# Patient Record
Sex: Male | Born: 1975
Health system: Southern US, Community
[De-identification: ages and names within clinical notes are randomized; demographics above are authoritative.]

## PROBLEM LIST (undated history)

## (undated) DIAGNOSIS — I10 Essential (primary) hypertension: Secondary | ICD-10-CM

## (undated) DIAGNOSIS — T7840XA Allergy, unspecified, initial encounter: Secondary | ICD-10-CM

## (undated) DIAGNOSIS — G4730 Sleep apnea, unspecified: Secondary | ICD-10-CM

## (undated) DIAGNOSIS — F329 Major depressive disorder, single episode, unspecified: Secondary | ICD-10-CM

## (undated) DIAGNOSIS — D573 Sickle-cell trait: Secondary | ICD-10-CM

## (undated) DIAGNOSIS — F32A Depression, unspecified: Secondary | ICD-10-CM

## (undated) DIAGNOSIS — M109 Gout, unspecified: Secondary | ICD-10-CM

## (undated) HISTORY — DX: Depression, unspecified: F32.A

## (undated) HISTORY — PX: WISDOM TOOTH EXTRACTION: SHX21

## (undated) HISTORY — DX: Allergy, unspecified, initial encounter: T78.40XA

## (undated) HISTORY — PX: POLYPECTOMY: SHX149

## (undated) HISTORY — DX: Sickle-cell trait: D57.3

## (undated) HISTORY — DX: Major depressive disorder, single episode, unspecified: F32.9

## (undated) HISTORY — DX: Sleep apnea, unspecified: G47.30

---

## 2001-04-01 ENCOUNTER — Ambulatory Visit (HOSPITAL_BASED_OUTPATIENT_CLINIC_OR_DEPARTMENT_OTHER): Admission: RE | Admit: 2001-04-01 | Discharge: 2001-04-01 | Payer: Self-pay | Admitting: Urology

## 2001-11-30 ENCOUNTER — Encounter: Payer: Self-pay | Admitting: Emergency Medicine

## 2001-11-30 ENCOUNTER — Emergency Department (HOSPITAL_COMMUNITY): Admission: EM | Admit: 2001-11-30 | Discharge: 2001-11-30 | Payer: Self-pay | Admitting: Emergency Medicine

## 2001-12-13 ENCOUNTER — Emergency Department (HOSPITAL_COMMUNITY): Admission: EM | Admit: 2001-12-13 | Discharge: 2001-12-13 | Payer: Self-pay | Admitting: Emergency Medicine

## 2001-12-13 ENCOUNTER — Encounter: Payer: Self-pay | Admitting: Emergency Medicine

## 2002-10-21 ENCOUNTER — Emergency Department (HOSPITAL_COMMUNITY): Admission: EM | Admit: 2002-10-21 | Discharge: 2002-10-21 | Payer: Self-pay | Admitting: Emergency Medicine

## 2006-09-18 ENCOUNTER — Encounter: Admission: RE | Admit: 2006-09-18 | Discharge: 2006-09-18 | Payer: Self-pay | Admitting: *Deleted

## 2006-09-18 IMAGING — CT CT ABDOMEN WO/W CM
2 of 6 series · 15 of 46 positions shown, 17 images · IV contrast (READICAT/WATER & [ID] OMNI 300)
Comparison: none

CLINICAL DATA: Abdominal pain. 
CT SCAN OF THE ABDOMEN WITH AND WITHOUT CONTRAST:
TECHNIQUE: Multidetector CT imaging of the abdomen was performed both before and during bolus administration of intravenous contrast.
Contrast:  cc Omnipaque 300 and oral contrast.
No comparison.
TECHNIQUE: Multidetector CT imaging of the pelvis was performed following the standard protocol during bolus administration of oral and intravenous contrast.

[Series 4: routine abdomen · axial · 0.90mm/px · z∈[-365,+20]mm · 12 of 92 slices shown, 14 images]
[im 8/92  soft-tissue]
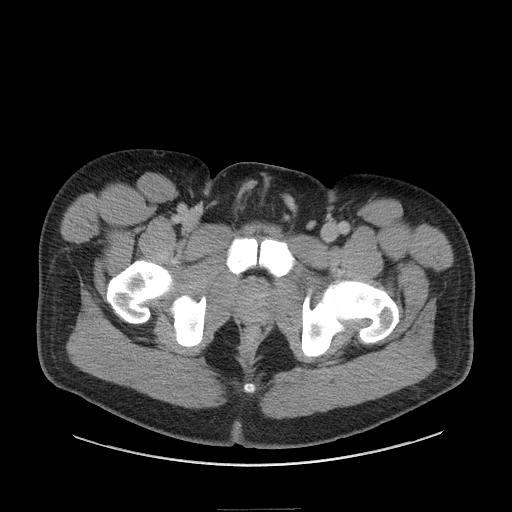
[im 8/92  bone]
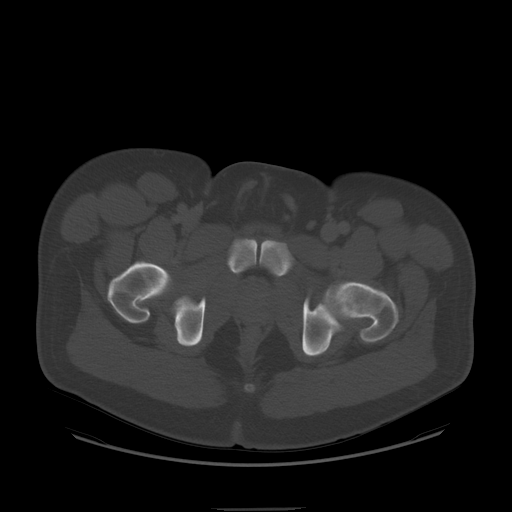
[im 15/92  soft-tissue]
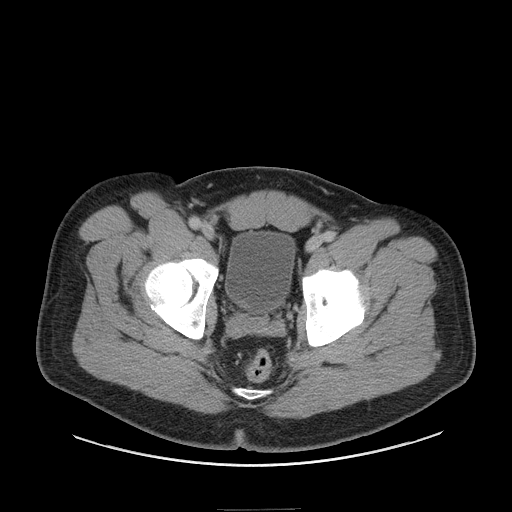
[im 22/92  soft-tissue]
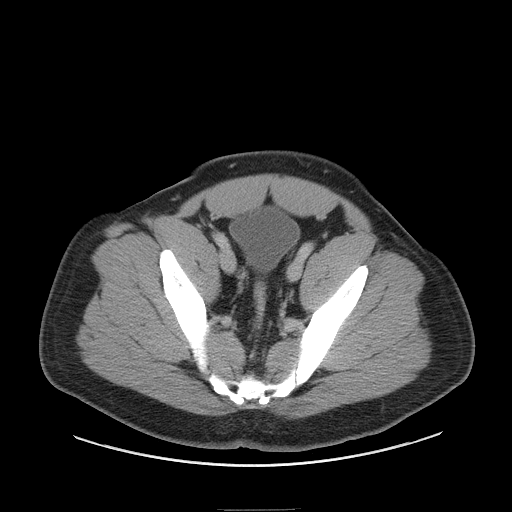
[im 29/92  soft-tissue]
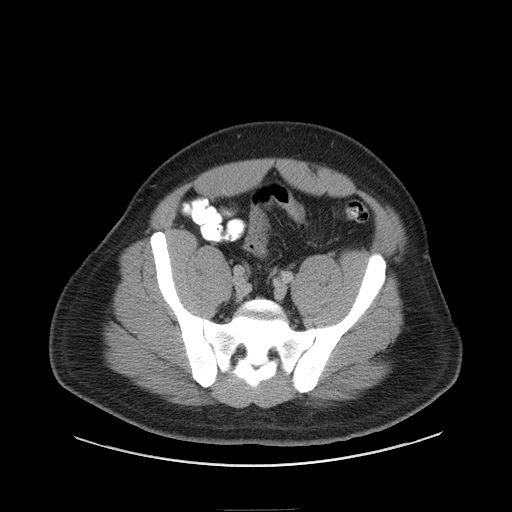
[im 36/92  soft-tissue]
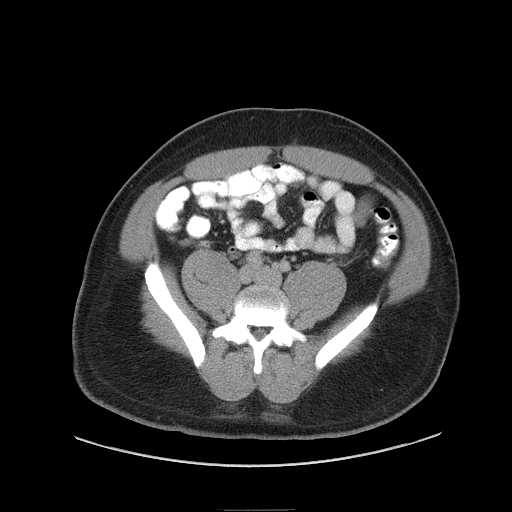
[im 43/92  soft-tissue]
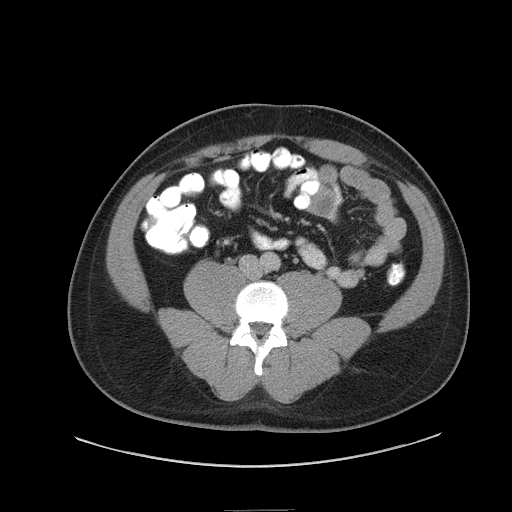
[im 50/92  soft-tissue]
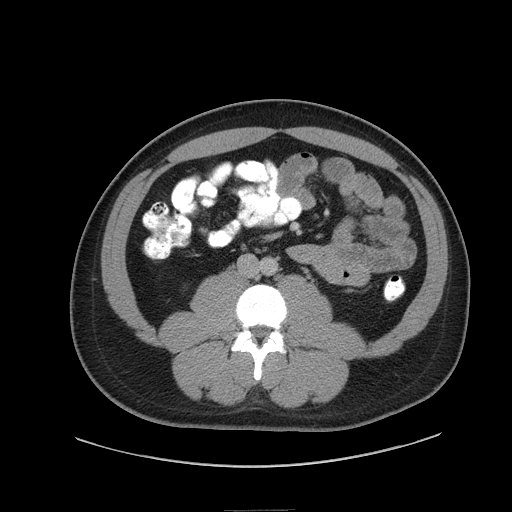
[im 57/92  soft-tissue]
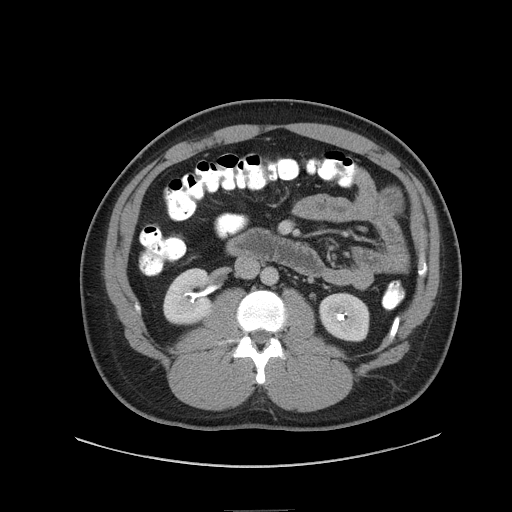
[im 64/92  soft-tissue]
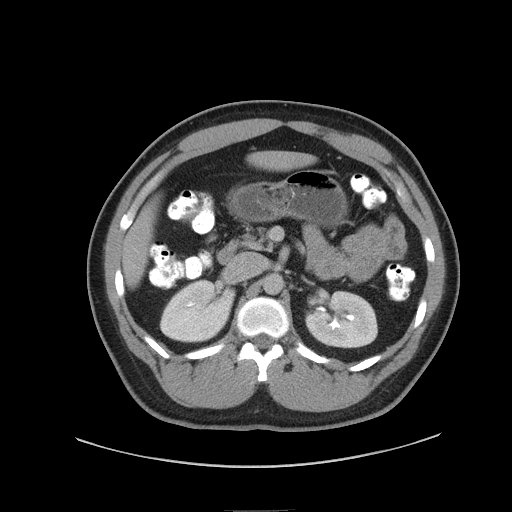
[im 64/92  bone]
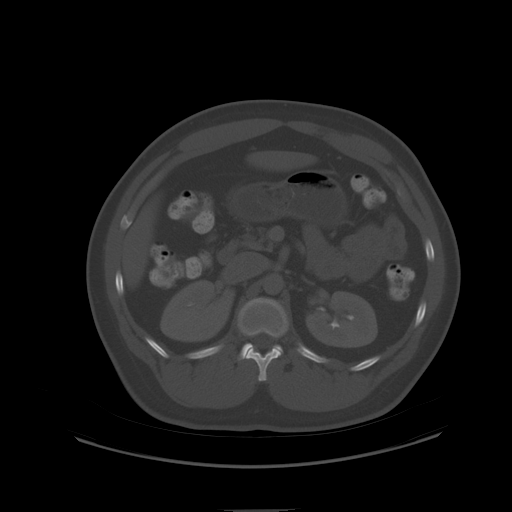
[im 71/92  soft-tissue]
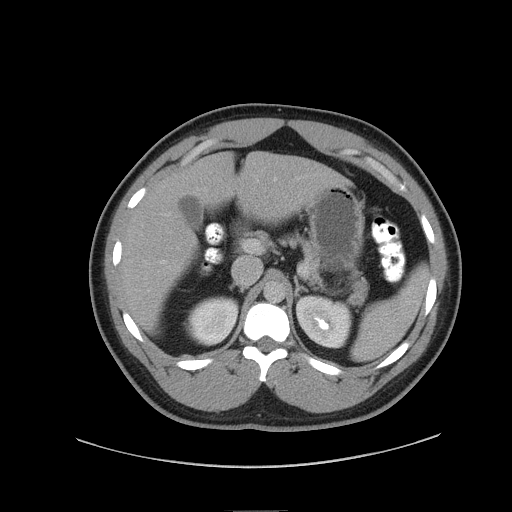
[im 78/92  soft-tissue]
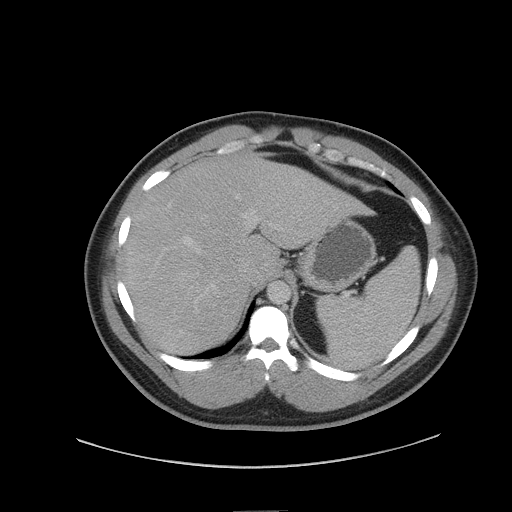
[im 85/92  soft-tissue]
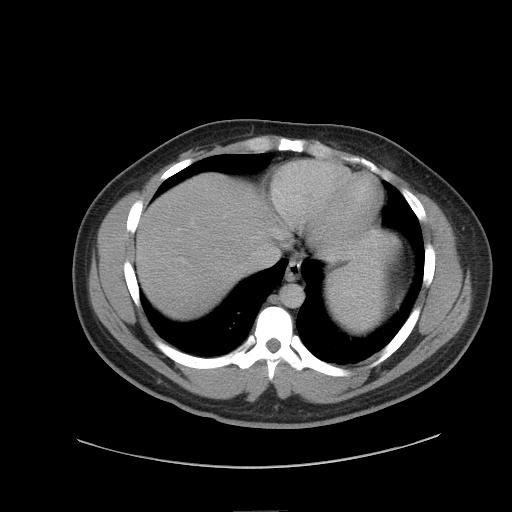

[Series 602: sagittal body · sagittal · 0.90mm/px · 3 of 175 slices shown]
[im 59/175  soft-tissue]
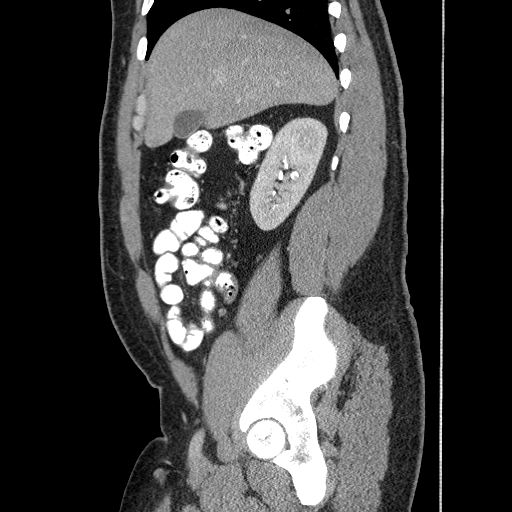
[im 78/175  soft-tissue]
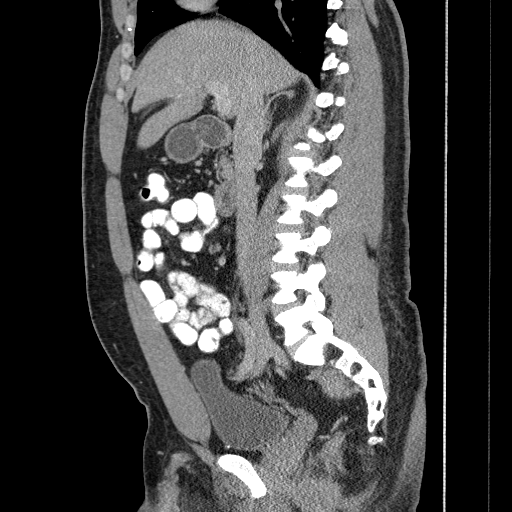
[im 97/175  soft-tissue]
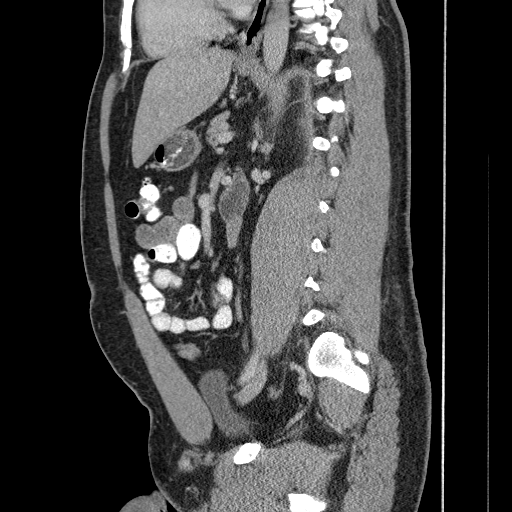

[15 of 46 positions shown; findings below may reference images not displayed]

FINDINGS: Minimal atelectatic changes lung bases.  No renal calculi.  Minimal caliectasis right upper pole region of questionable significance.  No renal mass.  The liver, spleen, pancreas, and adrenal glands unremarkable.  Slight increased number of normal sized mesenteric lymph nodes slightly greater on the right.  Significance indeterminate.  Surrounding small bowel or colonic abnormality is not identified.  Appendix is contrast and air filled without surrounding inflammation.  No hernia.  No abdominal aortic aneurysm.  Very small hiatal hernia.  No bony destructive lesion.
IMPRESSION: 1.  Slight increased number of normal sized to top normal sized mesenteric lymph nodes of questionable etiology or significance.  No surrounding bowel abnormality.
2.   Mild fatty infiltration of the liver without focal lesion.  
3.  There may be a very small sliding type hiatal hernia.
4.  Minimal fullness right upper pole caliceal system may be congenital for this patient as no obstructing lesion is identified.
PELVIS CT WITH CONTRAST:
FINDINGS: Elongated, but benign appearing inguinal lymph nodes.  Minimal lobularity of prostate gland of questionable significance.  No pelvic inflammatory process or abnormal fluid collection.
IMPRESSION: No pelvic abnormal fluid collection or inflammatory process.  Please see above.

## 2007-07-02 ENCOUNTER — Emergency Department (HOSPITAL_COMMUNITY): Admission: EM | Admit: 2007-07-02 | Discharge: 2007-07-02 | Payer: Self-pay | Admitting: Emergency Medicine

## 2007-09-12 ENCOUNTER — Ambulatory Visit: Payer: Self-pay | Admitting: Gastroenterology

## 2007-09-12 LAB — CONVERTED CEMR LAB
ALT: 35 units/L (ref 0–53)
AST: 27 units/L (ref 0–37)
Albumin: 3.8 g/dL (ref 3.5–5.2)
Alkaline Phosphatase: 57 units/L (ref 39–117)
BUN: 13 mg/dL (ref 6–23)
Basophils Absolute: 0.2 10*3/uL — ABNORMAL HIGH (ref 0.0–0.1)
Basophils Relative: 2.5 % — ABNORMAL HIGH (ref 0.0–1.0)
Bilirubin, Direct: 0.1 mg/dL (ref 0.0–0.3)
CO2: 31 meq/L (ref 19–32)
Calcium: 9.7 mg/dL (ref 8.4–10.5)
Chloride: 105 meq/L (ref 96–112)
Creatinine, Ser: 1.4 mg/dL (ref 0.4–1.5)
Eosinophils Absolute: 0.1 10*3/uL (ref 0.0–0.6)
Eosinophils Relative: 1.5 % (ref 0.0–5.0)
GFR calc Af Amer: 76 mL/min
GFR calc non Af Amer: 63 mL/min
Glucose, Bld: 72 mg/dL (ref 70–99)
HCT: 43.9 % (ref 39.0–52.0)
Hemoglobin: 14.9 g/dL (ref 13.0–17.0)
Lymphocytes Relative: 21.3 % (ref 12.0–46.0)
MCHC: 34 g/dL (ref 30.0–36.0)
MCV: 83.8 fL (ref 78.0–100.0)
Monocytes Absolute: 0.6 10*3/uL (ref 0.2–0.7)
Monocytes Relative: 6.5 % (ref 3.0–11.0)
Neutro Abs: 6.9 10*3/uL (ref 1.4–7.7)
Neutrophils Relative %: 68.2 % (ref 43.0–77.0)
Platelets: 228 10*3/uL (ref 150–400)
Potassium: 4.2 meq/L (ref 3.5–5.1)
RBC: 5.23 M/uL (ref 4.22–5.81)
RDW: 14.1 % (ref 11.5–14.6)
Sodium: 141 meq/L (ref 135–145)
TSH: 0.55 microintl units/mL (ref 0.35–5.50)
Total Bilirubin: 0.9 mg/dL (ref 0.3–1.2)
Total Protein: 7.3 g/dL (ref 6.0–8.3)
WBC: 9.9 10*3/uL (ref 4.5–10.5)

## 2007-10-14 ENCOUNTER — Ambulatory Visit: Payer: Self-pay | Admitting: Gastroenterology

## 2007-10-14 ENCOUNTER — Encounter: Payer: Self-pay | Admitting: Gastroenterology

## 2007-10-14 DIAGNOSIS — D126 Benign neoplasm of colon, unspecified: Secondary | ICD-10-CM | POA: Insufficient documentation

## 2007-12-11 DIAGNOSIS — K649 Unspecified hemorrhoids: Secondary | ICD-10-CM | POA: Insufficient documentation

## 2007-12-11 DIAGNOSIS — R109 Unspecified abdominal pain: Secondary | ICD-10-CM | POA: Insufficient documentation

## 2007-12-11 DIAGNOSIS — K589 Irritable bowel syndrome without diarrhea: Secondary | ICD-10-CM | POA: Insufficient documentation

## 2007-12-11 HISTORY — DX: Unspecified hemorrhoids: K64.9

## 2008-06-16 ENCOUNTER — Telehealth: Payer: Self-pay | Admitting: Gastroenterology

## 2008-06-16 ENCOUNTER — Encounter: Payer: Self-pay | Admitting: Gastroenterology

## 2008-07-07 ENCOUNTER — Ambulatory Visit: Payer: Self-pay | Admitting: Gastroenterology

## 2009-09-12 ENCOUNTER — Emergency Department (HOSPITAL_COMMUNITY): Admission: EM | Admit: 2009-09-12 | Discharge: 2009-09-12 | Payer: Self-pay | Admitting: Emergency Medicine

## 2009-09-12 IMAGING — CR DG FOOT COMPLETE 3+V*R*
3 series · 3 of 3 positions shown · non-contrast
Comparison: None available.

CLINICAL DATA: Medial foot pain.

RIGHT FOOT COMPLETE - 3+ VIEW

[view not recorded (1 of 3)]
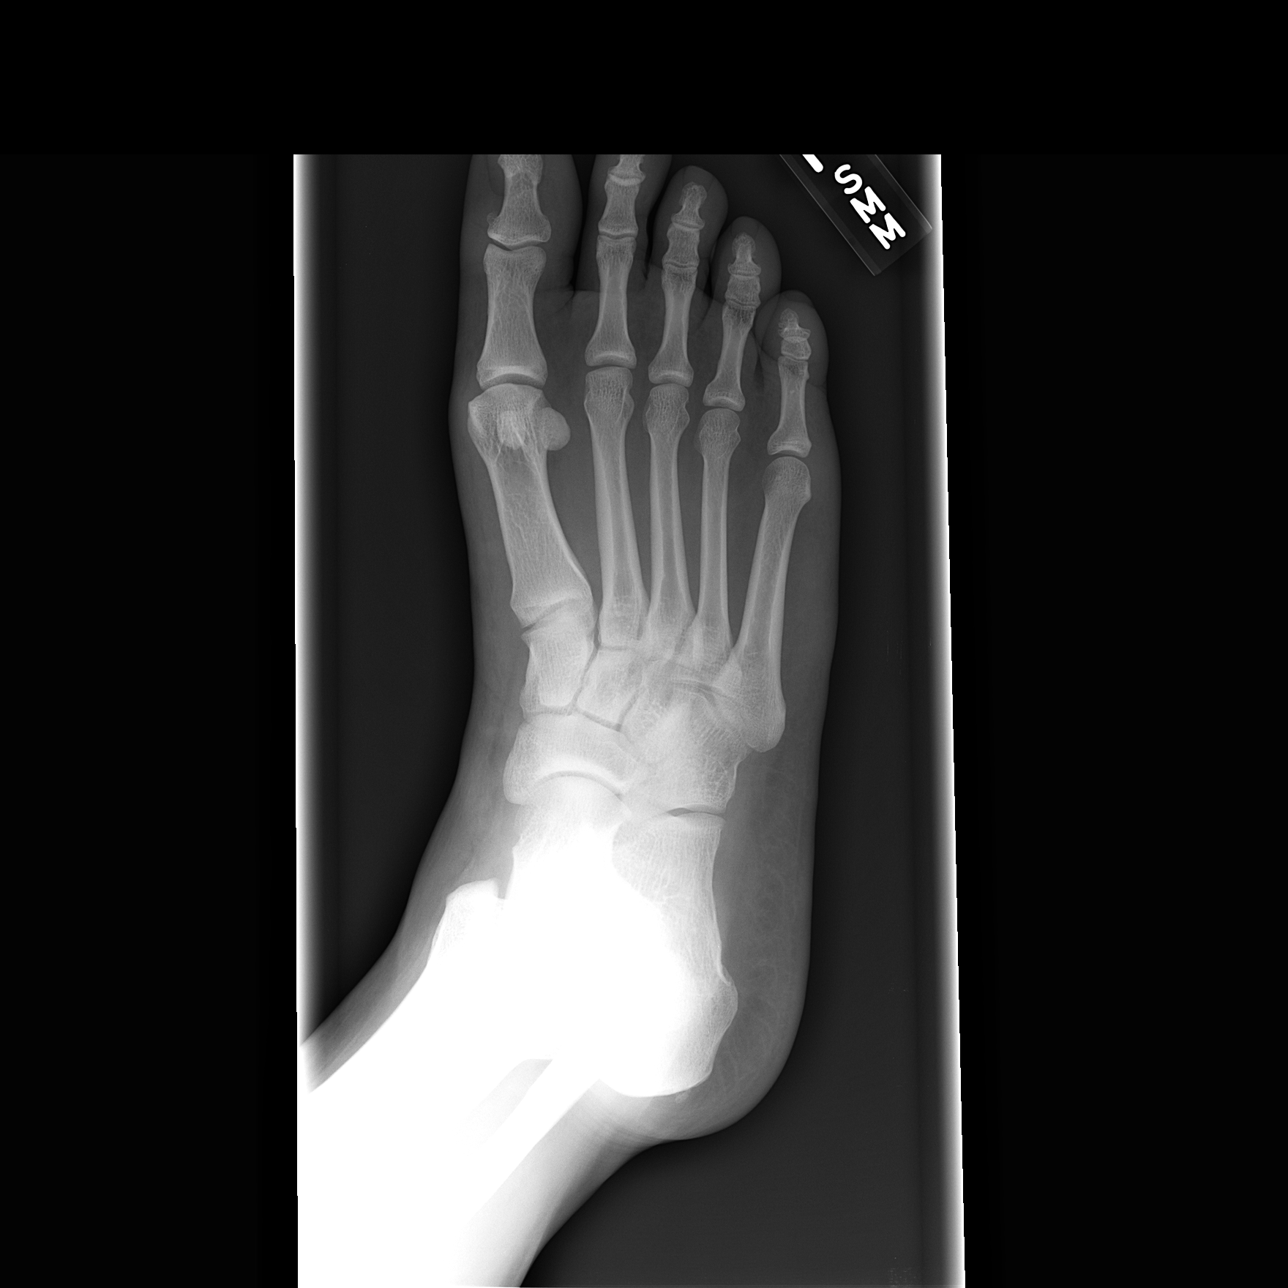

[view not recorded (2 of 3)]
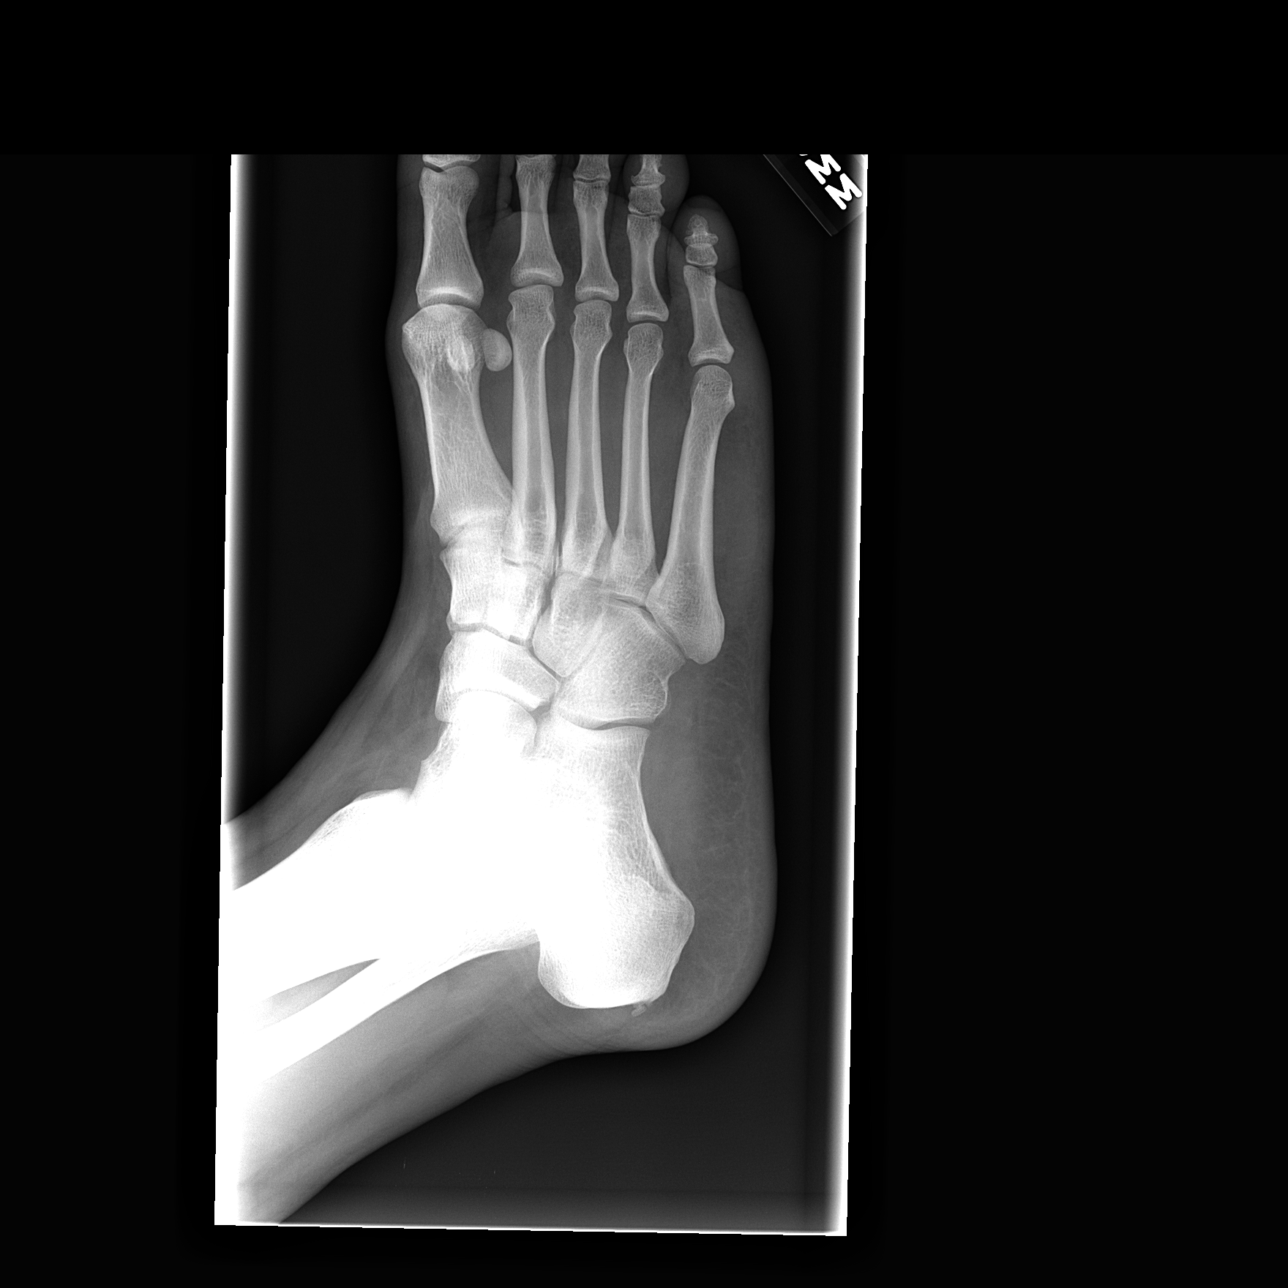

[view not recorded (3 of 3)]
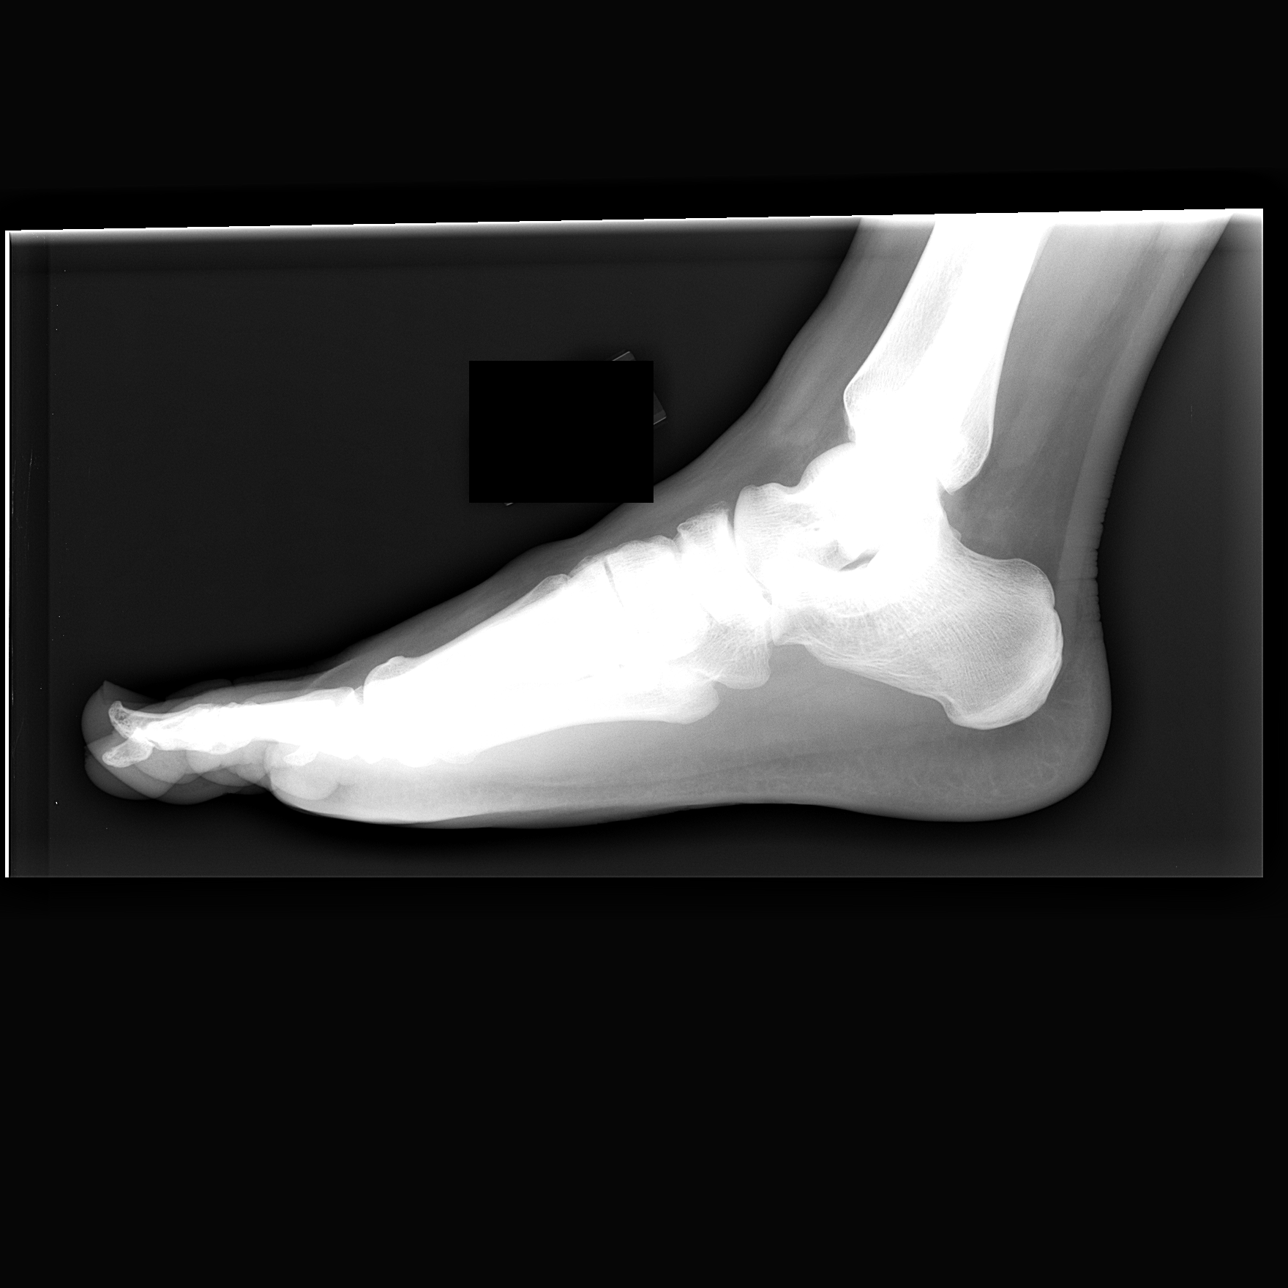

[3 of 3 positions shown; findings below may reference images not displayed]

FINDINGS: There is no acute bony or joint abnormality.  Mild hallux
valgus deformity noted.
IMPRESSION: 1.  No acute finding.
2.  Mild appearing hallux valgus.

## 2010-09-19 ENCOUNTER — Encounter: Payer: Self-pay | Admitting: Gastroenterology

## 2010-10-29 HISTORY — PX: COLONOSCOPY: SHX174

## 2010-11-07 ENCOUNTER — Encounter (INDEPENDENT_AMBULATORY_CARE_PROVIDER_SITE_OTHER): Payer: Self-pay | Admitting: *Deleted

## 2010-11-28 NOTE — Letter (Signed)
Summary: Colonoscopy Letter  [facility]  [address] [place], [place] [address]   Phone: [phone]  Fax: [phone]      September 19, 2010 MRN: [id number]   [facility] [address] [place], [place]  [address]   Dear Mr. AUZENNE,   According to your medical record, it is time for you to schedule a Colonoscopy. The American Cancer Society recommends this procedure as a method to detect early colon cancer. Patients with a family history of colon cancer, or a personal history of colon polyps or inflammatory bowel disease are at increased risk.  This letter has been generated based on the recommendations made at the time of your procedure. If you feel that in your particular situation this may no longer apply, please contact our office.  Please call our office at [phone] to schedule this appointment or to update your records at your earliest convenience.  Thank you for cooperating with [place] to provide you with the very best care possible.   Sincerely,  Rachael Fee, M.D.  [facility] Gastroenterology Division [phone]

## 2010-11-30 NOTE — Letter (Signed)
Summary: New Patient letter  [facility]  [address] [place], [place] [address]   Phone: [phone]  Fax: [phone]       11/07/2010 MRN: [id number]  [facility] [address] [place], [place]  [address]  Dear Joseph Ferguson,  Welcome to the Gastroenterology Division at [facility].    You are scheduled to see Dr. Rob Bunting on February15, 2012 at 10:30am on the 3rd floor at [facility], [address]  We ask that you try to arrive at our office 15 minutes prior to your appointment time to allow for check-in.  We would like you to complete the enclosed self-administered evaluation form prior to your visit and bring it with you on the day of your appointment.  We will review it with you.  Also, please bring a complete list of all your medications or, if you prefer, bring the medication bottles and we will list them.  Please bring your insurance card so that we may make a copy of it.  If your insurance requires a referral to see a specialist, please bring your referral form from your primary care physician.  Co-payments are due at the time of your visit and may be paid by cash, check or credit card.     Your office visit will consist of a consult with your physician (includes a physical exam), any laboratory testing he/she may order, scheduling of any necessary diagnostic testing (e.g. x-ray, ultrasound, CT-scan), and scheduling of a procedure (e.g. Endoscopy, Colonoscopy) if required.  Please allow enough time on your schedule to allow for any/all of these possibilities.    If you cannot keep your appointment, please call [phone] to cancel or reschedule prior to your appointment date.  This allows [place] the opportunity to schedule an appointment for another patient in need of care.  If you do not cancel or reschedule by 5 p.m. the business day prior to your appointment date, you will be charged a $50.00 late cancellation/no-show fee.    Thank you  for choosing [facility] for your medical needs.  We appreciate the opportunity to care for you.  Please visit [place] at our website  to learn more about our practice.                     Sincerely,                                                             The Gastroenterology Division

## 2010-12-13 ENCOUNTER — Ambulatory Visit (INDEPENDENT_AMBULATORY_CARE_PROVIDER_SITE_OTHER): Payer: 59 | Admitting: Gastroenterology

## 2010-12-13 ENCOUNTER — Encounter: Payer: Self-pay | Admitting: Gastroenterology

## 2010-12-13 ENCOUNTER — Encounter (INDEPENDENT_AMBULATORY_CARE_PROVIDER_SITE_OTHER): Payer: Self-pay | Admitting: *Deleted

## 2010-12-13 DIAGNOSIS — Z86010 Personal history of colonic polyps: Secondary | ICD-10-CM

## 2010-12-20 NOTE — Assessment & Plan Note (Signed)
Review of gastrointestinal problems: 1. TVA removed from colon 09/2007, recommended recall at 3 yr interval.    History of Present Illness Primary GI MD: Rob Bunting MD Primary Provider: n.a Chief Complaint: Increase in esophgeal spasms and wants to discuss next colonoscopy. History of Present Illness:     pleasant 35 year old man whom I last saw the time of a colonoscopy a little over 3 years ago. See those results summarized above. He is due for repeat colonoscopy around now.  can have esoph spasms, relieved with warm water. Was happening in summer 2011, none in several months.  No worse GERD than usual.   weight overall stable.  Pills associated dysphagia only, not with food or water.  No overt bleeding.            Current Medications (verified): 1)  Robinul-Forte 2 Mg  Tabs (Glycopyrrolate) .Marland Kitchen.. 1 Tab Two Times A Day 2)  Mega Multivitamin For Men  Tabs (Multiple Vitamins-Minerals) .... 2 Tablets By Mouth Once Daily 3)  Benztropine Mesylate 1 Mg Tabs (Benztropine Mesylate) .... One Tablet By Mouth Once Daily 4)  Prozac 20 Mg Caps (Fluoxetine Hcl) .... One Tablet By Mouth Once Daily 5)  Zyrtec Allergy 10 Mg Tabs (Cetirizine Hcl) .... One Tablet By Mouth Once Daily 6)  Tylenol 325 Mg Tabs (Acetaminophen) .... As Needed 7)  Imitrex 50 Mg Tabs (Sumatriptan Succinate) .... One Tablet By Mouth As Needed  Allergies (verified): No Known Drug Allergies  Vital Signs:  Patient profile:   35 year old male Height:      70 inches Weight:      246.38 pounds BMI:     35.48 Pulse rate:   88 / minute Pulse rhythm:   regular BP sitting:   120 / 84  (left arm) Cuff size:   regular  Vitals Entered By: Christie Nottingham CMA Duncan Dull) (December 13, 2010 10:46 AM)  Physical Exam  Additional Exam:  Constitutional: generally well appearing Psychiatric: alert and oriented times 3 Abdomen: soft, non-tender, non-distended, normal bowel sounds    Impression &  Recommendations:  Problem # 1:  history of precancerous polyps, resolved esophageal spasms he is due for repeat colonoscopy and we will set that up at his soonest convenience. He had some vague, minor spasm type sensation in his upper throat, esophagus. This has not been a problem for several months I don't think we need to proceed with any endoscopic evaluation at this point.  Patient Instructions: 1)  You will be scheduled to have a colonoscopy. 2)  The medication list was reviewed and reconciled.  All changed / newly prescribed medications were explained.  A complete medication list was provided to the patient / caregiver.  Appended Document: Orders Update/Movi    Clinical Lists Changes  Medications: Added new medication of MOVIPREP 100 GM  SOLR (PEG-KCL-NACL-NASULF-NA ASC-C) As per prep instructions. - Signed Rx of MOVIPREP 100 GM  SOLR (PEG-KCL-NACL-NASULF-NA ASC-C) As per prep instructions.;  #1 x 0;  Signed;  Entered by: Chales Abrahams CMA (AAMA);  Authorized by: Rachael Fee MD;  Method used: Electronically to [facility] [address], [place], [place], [place]  [address], Ph: [phone], Fax: [phone] Orders: Added new Test order of Colonoscopy (Colon) - Signed    Prescriptions: MOVIPREP 100 GM  SOLR (PEG-KCL-NACL-NASULF-NA ASC-C) As per prep instructions.  #1 x 0   Entered by:   Chales Abrahams CMA (AAMA)   Authorized by:   Rachael Fee MD  Signed by:   Chales Abrahams CMA (AAMA) on 12/13/2010   Method used:   Electronically to        [facility] (retail)       [address]       [place], [place]  [address]       Ph: [phone]       Fax: [phone]   RxID:   [id number]

## 2010-12-20 NOTE — Letter (Signed)
Summary: [facility] Instructions  [facility]  [address] [place], [place] [address]   Phone: [phone]  Fax: [phone]       Joseph Ferguson    [birth year]    MRN: [id number]        Procedure Day /Date:01/09/11 TUE     Arrival Time:3 pm     Procedure Time:4 pm     Location of Procedure:                    X  [facility] (4th Floor)                        PREPARATION FOR COLONOSCOPY WITH MOVIPREP   Starting 5 days prior to your procedure 01/04/11 do not eat nuts, seeds, popcorn, corn, beans, peas,  salads, or any raw vegetables.  Do not take any fiber supplements (e.g. Metamucil, Citrucel, and Benefiber).  THE DAY BEFORE YOUR PROCEDURE         DATE: 01/08/11  DAY: MON  1.  Drink clear liquids the entire day-NO SOLID FOOD  2.  Do not drink anything colored red or purple.  Avoid juices with pulp.  No orange juice.  3.  Drink at least 64 oz. (8 glasses) of fluid/clear liquids during the day to prevent dehydration and help the prep work efficiently.  CLEAR LIQUIDS INCLUDE: Water Jello Ice Popsicles Tea (sugar ok, no milk/cream) Powdered fruit flavored drinks Coffee (sugar ok, no milk/cream) Gatorade Juice: apple, white grape, white cranberry  Lemonade Clear bullion, consomm, broth Carbonated beverages (any kind) Strained chicken noodle soup Hard Candy                             4.  In the morning, mix first dose of MoviPrep solution:    Empty 1 Pouch A and 1 Pouch B into the disposable container    Add lukewarm drinking water to the top line of the container. Mix to dissolve    Refrigerate (mixed solution should be used within 24 hrs)  5.  Begin drinking the prep at 5:00 p.m. The MoviPrep container is divided by 4 marks.   Every 15 minutes drink the solution down to the next mark (approximately 8 oz) until the full liter is complete.   6.  Follow completed prep with 16 oz of clear liquid of your choice (Nothing red or purple).   Continue to drink clear liquids until bedtime.  7.  Before going to bed, mix second dose of MoviPrep solution:    Empty 1 Pouch A and 1 Pouch B into the disposable container    Add lukewarm drinking water to the top line of the container. Mix to dissolve    Refrigerate  THE DAY OF YOUR PROCEDURE      DATE: 01/09/11 DAY: TUE  Beginning at 11 a.m. (5 hours before procedure):         1. Every 15 minutes, drink the solution down to the next mark (approx 8 oz) until the full liter is complete.  2. Follow completed prep with 16 oz. of clear liquid of your choice.    3. You may drink clear liquids until 2 pm (2 HOURS BEFORE PROCEDURE).   MEDICATION INSTRUCTIONS  Unless otherwise instructed, you should take regular prescription medications with a small sip of water   as early as possible the morning of your procedure.  OTHER INSTRUCTIONS  You will need a responsible adult at least 35 years of age to accompany you and drive you home.   This person must remain in the waiting room during your procedure.  Wear loose fitting clothing that is easily removed.  Leave jewelry and other valuables at home.  However, you may wish to bring a book to read or  an iPod/MP3 player to listen to music as you wait for your procedure to start.  Remove all body piercing jewelry and leave at home.  Total time from sign-in until discharge is approximately 2-3 hours.  You should go home directly after your procedure and rest.  You can resume normal activities the  day after your procedure.  The day of your procedure you should not:   Drive   Make legal decisions   Operate machinery   Drink alcohol   Return to work  You will receive specific instructions about eating, activities and medications before you leave.    The above instructions have been reviewed and explained to me by   _______________________    I fully understand and can verbalize these instructions  _____________________________ Date _________

## 2010-12-22 ENCOUNTER — Inpatient Hospital Stay (INDEPENDENT_AMBULATORY_CARE_PROVIDER_SITE_OTHER)
Admission: RE | Admit: 2010-12-22 | Discharge: 2010-12-22 | Disposition: A | Discharge status: Home / Self Care | Payer: 59 | Source: Ambulatory Visit | Attending: Emergency Medicine | Admitting: Emergency Medicine

## 2010-12-22 DIAGNOSIS — H81399 Other peripheral vertigo, unspecified ear: Secondary | ICD-10-CM

## 2011-01-09 ENCOUNTER — Encounter: Payer: Self-pay | Admitting: Gastroenterology

## 2011-01-09 ENCOUNTER — Other Ambulatory Visit (AMBULATORY_SURGERY_CENTER): Payer: 59 | Admitting: Gastroenterology

## 2011-01-09 DIAGNOSIS — Z86010 Personal history of colonic polyps: Secondary | ICD-10-CM

## 2011-01-09 DIAGNOSIS — Z1211 Encounter for screening for malignant neoplasm of colon: Secondary | ICD-10-CM

## 2011-01-16 NOTE — Procedures (Signed)
Summary: Colonoscopy  Patient: Joseph Ferguson Note: All result statuses are Final unless otherwise noted.  Tests: (1) Colonoscopy (COL)   COL Colonoscopy           DONE     [facility]     [address]     [place], [place]  [address]          COLONOSCOPY PROCEDURE REPORT          PATIENT:  Krew, Hortman  MR#:  [id number]     BIRTHDATE:  20-May-1976, 35 yrs. old  GENDER:  male     ENDOSCOPIST:  Rachael Fee, MD     PROCEDURE DATE:  01/09/2011     PROCEDURE:  Diagnostic Colonoscopy     ASA CLASS:  Class II     INDICATIONS:  history of pre-cancerous (adenomatous) colon polyps,     2008     MEDICATIONS:   Fentanyl 75 mcg IV, Versed 8 mg IV          DESCRIPTION OF PROCEDURE:   After the risks benefits and     alternatives of the procedure were thoroughly explained, informed     consent was obtained.  Digital rectal exam was performed and     revealed no rectal masses.   The LB 180AL [id number] endoscope was     introduced through the anus and advanced to the cecum, which was     identified by both the appendix and ileocecal valve, without     limitations.  The quality of the prep was good, using MoviPrep.     The instrument was then slowly withdrawn as the colon was fully     examined.     <<PROCEDUREIMAGES>>     FINDINGS:  A normal appearing cecum, ileocecal valve, and     appendiceal orifice were identified. The ascending, hepatic     flexure, transverse, splenic flexure, descending, sigmoid colon,     and rectum appeared unremarkable (see image1, image3, and image4).     Retroflexed views in the rectum revealed no abnormalities.    The     scope was then withdrawn from the patient and the procedure     completed.     COMPLICATIONS:  None          ENDOSCOPIC IMPRESSION:     1) Normal colon     2) No polyps or cancers          RECOMMENDATIONS:     1) Given your personal history of adenomatous (pre-cancerous)     polyps, you will need a repeat colonoscopy  in 5 years.          REPEAT EXAM:  5 years          ______________________________     Rachael Fee, MD          n.     eSIGNED:   Rachael Fee at 01/09/2011 04:03 PM          Belling, Oretha Milch, [id number]  Note: An exclamation mark (!) indicates a result that was not dispersed into the flowsheet. Document Creation Date: 01/09/2011 4:04 PM _______________________________________________________________________  (1) Order result status: Final Collection or observation date-time: 01/09/2011 15:58 Requested date-time:  Receipt date-time:  Reported date-time:  Referring Physician:   Ordering Physician: Rob Bunting [id number]) Specimen Source:  Source: Launa Grill Order Number: [id number] Lab site:   Appended Document: Colonoscopy    Clinical Lists Changes  Observations: Added new observation of  COLONNXTDUE: 12/2015 (01/09/2011 16:21)

## 2011-01-31 LAB — DIFFERENTIAL
Basophils Relative: 1 % (ref 0–1)
Eosinophils Absolute: 0.2 10*3/uL (ref 0.0–0.7)
Eosinophils Relative: 2 % (ref 0–5)
Lymphs Abs: 1.2 10*3/uL (ref 0.7–4.0)
Monocytes Absolute: 0.6 10*3/uL (ref 0.1–1.0)
Monocytes Relative: 7 % (ref 3–12)
Neutrophils Relative %: 75 % (ref 43–77)

## 2011-01-31 LAB — CBC
HCT: 43.4 % (ref 39.0–52.0)
MCHC: 33.5 g/dL (ref 30.0–36.0)
MCV: 84.7 fL (ref 78.0–100.0)
RBC: 5.13 MIL/uL (ref 4.22–5.81)
WBC: 7.9 10*3/uL (ref 4.0–10.5)

## 2011-03-13 NOTE — Assessment & Plan Note (Signed)
[facility]                         GASTROENTEROLOGY OFFICE NOTE   NAME:Joseph Ferguson, Joseph Ferguson                    MRN:          [id number]  DATE:09/12/2007                            DOB:          November 19, 1975    REFERRING PHYSICIAN:  Lucita Ferrara, M.D.   REASON FOR CONSULTATION:  Dr. Flonnie Overman asked me to evaluate the patient in  consultation regarding chronic abdominal cramping.   HISTORY OF PRESENT ILLNESS:  The patient is a very pleasant 35 year old  man who has had abdominal cramping since he was a young child.  He  recalls even before he was a teenager, he had intermittent cramps.  For  the past 2-3 years they seem to have gotten a bit worse.  He has had  left lower quadrant cramping, gas.  The discomforts are nearly constant.  They are sometimes sharp.  About a year ago the character changed to  being a more achy discomfort and he was evaluated by primary care  physician who performed a CT scan of his abdomen and pelvis.  These  showed some slightly increased number of normal size, top normal size  mesenteric lymph nodes of questionable etiology or significance.  Otherwise the CT scan with IV and oral contrast was fairly normal.  Several months ago, he was put on Carafate and H. Pylori antibody was  checked and that was negative.  He says after trying Carafate for 10  days he definitely noticed an improvement in his left lower quadrant  discomfort.  He does have intermittent rectal bleeding and says this is  usually around the time of some small hemorrhoids.  His bowels tend to  be looser than he feels they should be.   REVIEW OF SYSTEMS:  Notable for stable weight and is otherwise  essentially normal and is available on his nursing intake sheet.   PAST MEDICAL HISTORY:  Sickle cell trait.   CURRENT MEDICATIONS:  Multivitamin.   ALLERGIES:  No known drug allergies.  He is allergic to [facility].   SOCIAL HISTORY:  Married with two children.  He works  as an Charity fundraiser at [employer].  Nonsmoker and drinks alcohol occasionally.   FAMILY HISTORY:  His maternal great grandfather had colon cancer,  maternal great grandmother had breast cancer, maternal great grandfather  had prostate cancer.  Alcoholism in his father's side of the family.   PHYSICAL EXAMINATION:  VITAL SIGNS:  5 feet 10 inches, 235 pounds, blood  pressure 118/78, pulse 72.  CONSTITUTIONAL:  Generally well appearing.  Alert and oriented x3.  Eyes; extraocular movements intact.  Mouth; oropharynx moist with no  lesions.  NECK:  Supple with no lymphadenopathy.  HEART:  Regular rate and rhythm.  LUNGS:  Clear to auscultation bilaterally.  ABDOMEN:  Soft, nontender, and nondistended.  Normal bowel sounds.  EXTREMITIES:  No lower extremity edema.  SKIN:  No rashes or lesions on visible extremities.   ASSESSMENT:  A 35 year old man with chronic (1-2 decades) of abdominal  cramping.   I did not mention above, but the patient believes his abdominal  discomforts usually are somewhat improvement when  he has a good bowel  movement.  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  He does have intermittent rectal bleeding around  the times of hemorrhoids, but we should proceed with full colonoscopy to  rule out other possibilities including inflammatory bowel disease.  We  will get a basic set of labs including a CBC, a complete metabolic  profile, and thyroid testing done today.  I am also calling him in a  prescription for Robamol Forte that he will take one pill twice a day as  this does seem mostly like a spasm issue.     Rachael Fee, MD  Electronically Signed    DPJ/MedQ  DD: 09/12/2007  DT: 09/13/2007  Job #: [id number]   cc:   Lucita Ferrara, MD

## 2011-03-16 NOTE — Op Note (Signed)
[facility]  Patient:    Joseph Ferguson, Joseph Ferguson                      MRN: [id number] Proc. Date: 04/01/01 Adm. Date:  [id number] Attending:  Liborio Nixon                           Operative Report  PREOPERATIVE DIAGNOSIS:  Phimosis.  POSTOPERATIVE DIAGNOSIS:  Phimosis.  OPERATION PERFORMED:  Circumcision.  SURGEON:  Bertram Millard. Dahlstedt, M.D.  ANESTHESIA:  Local with MAC.  COMPLICATIONS:  None.  INDICATIONS FOR PROCEDURE:  The patient is a 35 year old male with recurrent tearing and pain in his foreskin after intercourse.  Due to this persistent problem, and the fact that the patient has to be abstinent for quite a few days after having had sex, the patient recently presented to my office with this problem.  I recommended circumcision.  He has mild phimosis but no evident balanitis.  He has no other penile lesions.  The risks and complications as well as alternatives to circumcision were discussed with the patient.  He desires to proceed.  DESCRIPTION OF PROCEDURE:  The patient was administered preoperative IV antibiotics and was taken to the operating room where he was given monitored anesthesia care.  A local of 50/50 solution of 0.25% plain Marcaine and 1% plain lidocaine was administered as a dorsal penile block.  Following this, two circumcising incisions were made, one in the distal and one in the proximal foreskin.  The foreskin was excised using electrocautery.  Small bleeders were electrocoagulated.  Two separate simple sutures of 4-0 chromic were placed in the frenulum area and a U-stitch was used to reapproximate the ventral penile skin with the distal end of the incision.  Quadrant sutures of the same 4-0 chromic were then placed in simple fashion.  These were interrupted.  In between these interrupted quadrant sutures, the 4-0 chromic was run in a simple running fashion.  At this point there was excellent hemostasis.  A  dry sterile dressing was placed.  The patient tolerated the procedure well and was returned to the post anesthesia care unit in stable condition.  ESTIMATED BLOOD LOSS:  120 cc.  He will follow up in my office in approximately two weeks.  He was given a discharge instruction sheet and a prescription for Vicodin #20. DD:  04/01/01 TD:  04/01/01 Job: [id number]

## 2011-07-17 DIAGNOSIS — G43909 Migraine, unspecified, not intractable, without status migrainosus: Secondary | ICD-10-CM | POA: Insufficient documentation

## 2011-07-17 DIAGNOSIS — E669 Obesity, unspecified: Secondary | ICD-10-CM | POA: Insufficient documentation

## 2011-07-17 DIAGNOSIS — Z800 Family history of malignant neoplasm of digestive organs: Secondary | ICD-10-CM | POA: Insufficient documentation

## 2011-07-17 DIAGNOSIS — Z860100 Personal history of colon polyps, unspecified: Secondary | ICD-10-CM

## 2011-07-17 HISTORY — DX: Personal history of colon polyps, unspecified: Z86.0100

## 2011-07-17 HISTORY — DX: Personal history of colonic polyps: Z86.010

## 2011-07-24 DIAGNOSIS — F32A Depression, unspecified: Secondary | ICD-10-CM | POA: Insufficient documentation

## 2011-07-24 DIAGNOSIS — E785 Hyperlipidemia, unspecified: Secondary | ICD-10-CM | POA: Insufficient documentation

## 2013-01-25 ENCOUNTER — Encounter (HOSPITAL_COMMUNITY): Payer: Self-pay | Admitting: *Deleted

## 2013-01-25 ENCOUNTER — Emergency Department (HOSPITAL_COMMUNITY)
Admission: EM | Admit: 2013-01-25 | Discharge: 2013-01-25 | Disposition: A | Discharge status: Home / Self Care | Payer: BC Managed Care – PPO | Source: Home / Self Care | Attending: Family Medicine | Admitting: Family Medicine

## 2013-01-25 ENCOUNTER — Emergency Department (INDEPENDENT_AMBULATORY_CARE_PROVIDER_SITE_OTHER): Payer: BC Managed Care – PPO

## 2013-01-25 DIAGNOSIS — IMO0002 Reserved for concepts with insufficient information to code with codable children: Secondary | ICD-10-CM

## 2013-01-25 DIAGNOSIS — S76011A Strain of muscle, fascia and tendon of right hip, initial encounter: Secondary | ICD-10-CM

## 2013-01-25 HISTORY — DX: Gout, unspecified: M10.9

## 2013-01-25 HISTORY — DX: Essential (primary) hypertension: I10

## 2013-01-25 IMAGING — CR DG HIP COMPLETE 2+V*R*
3 series · 3 of 3 positions shown · non-contrast
Comparison: Pelvic CT [DATE]

CLINICAL DATA: Twisted the right hip 1 day ago.  Pain in the
anterior hip.

RIGHT HIP - COMPLETE 2+ VIEW

[view not recorded (1 of 3)]
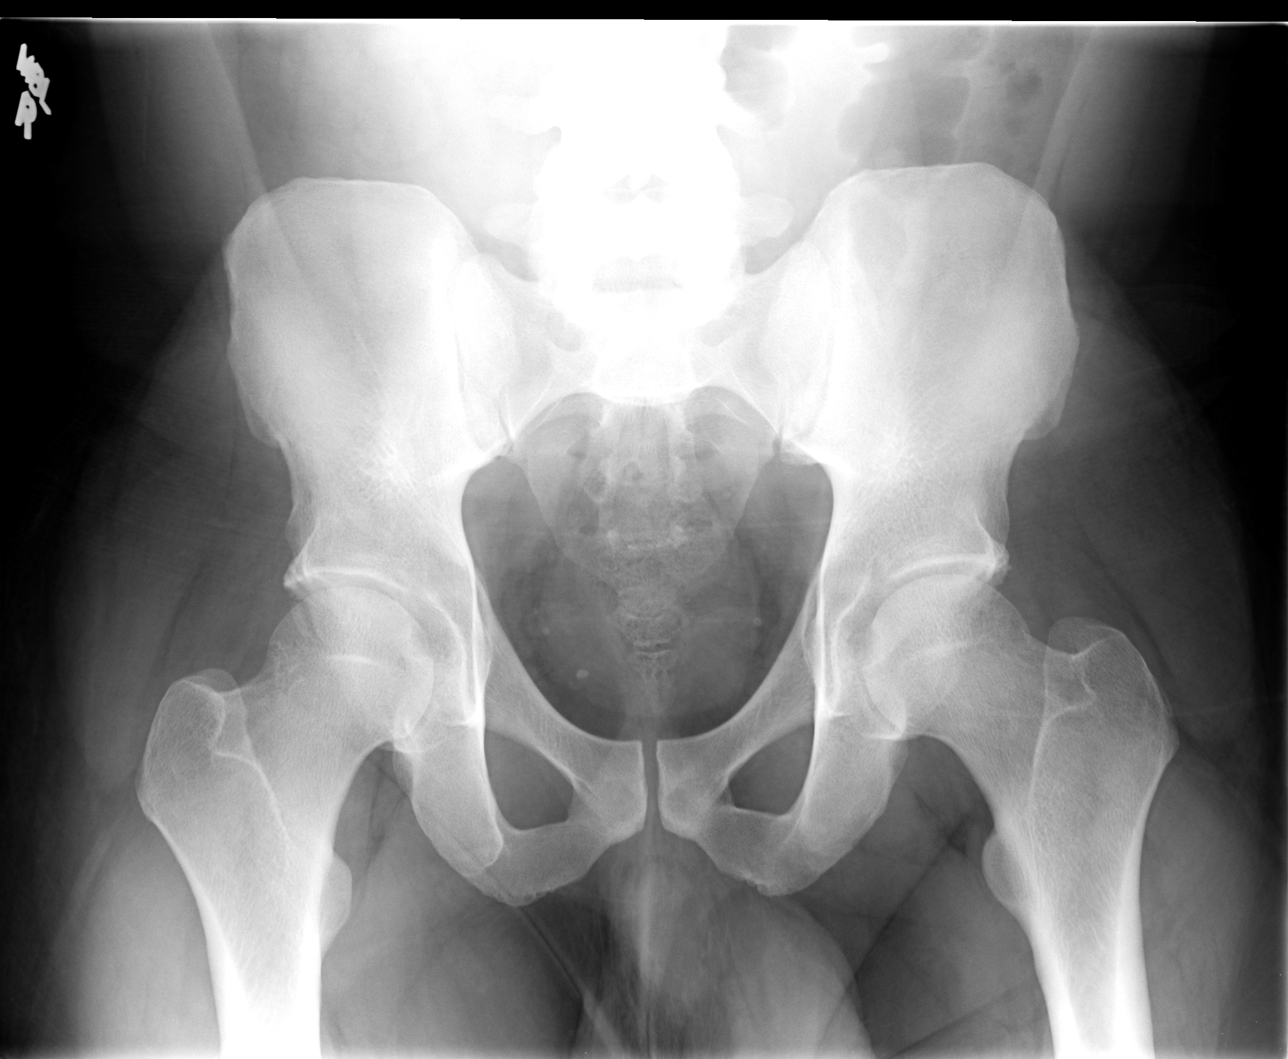

[view not recorded (2 of 3)]
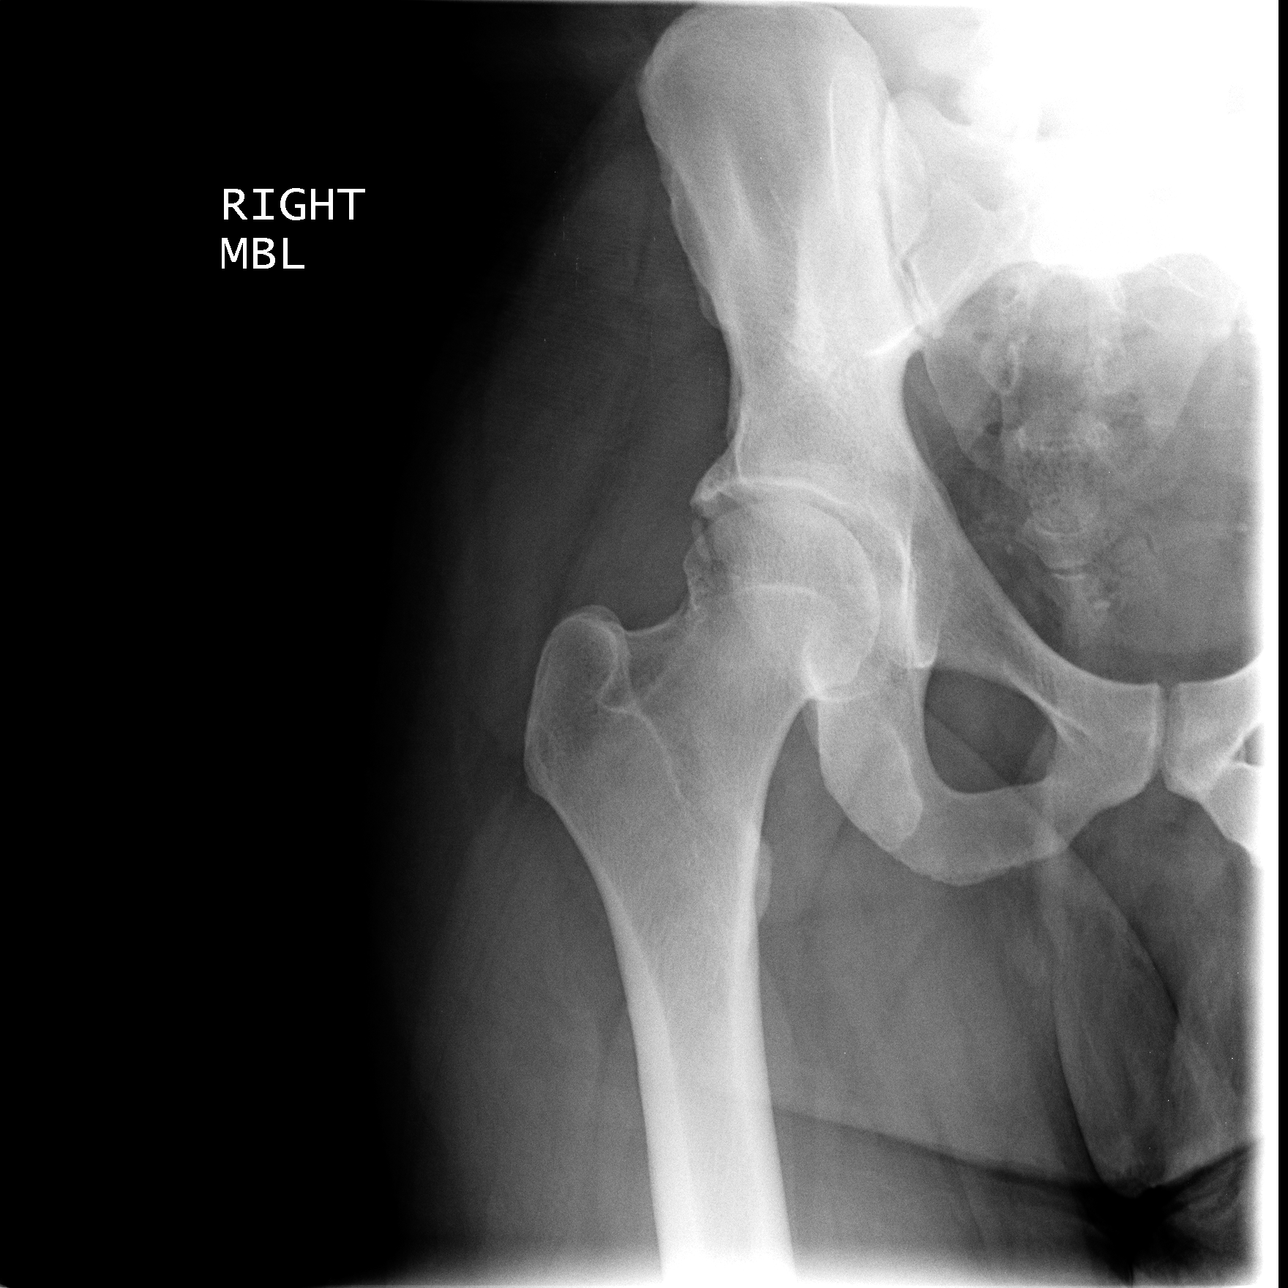

[view not recorded (3 of 3)]
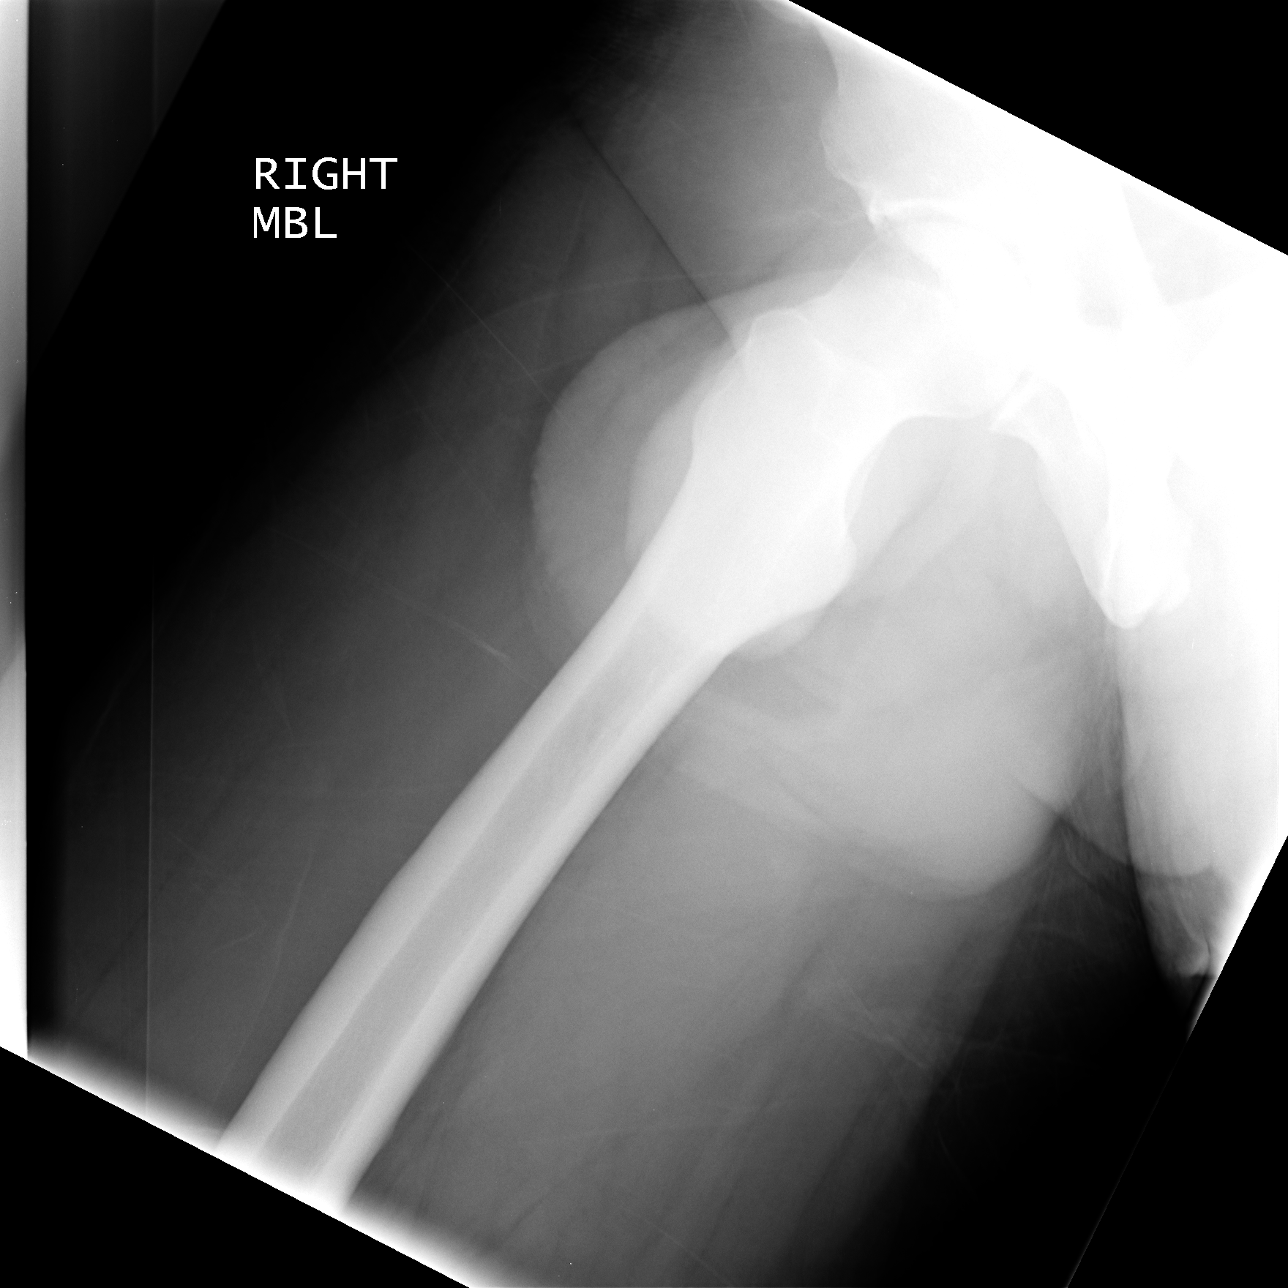

[3 of 3 positions shown; findings below may reference images not displayed]

FINDINGS: Single view of the pelvis and two views of the right hip
were obtained.  The pelvic bony ring appears to be intact.  There
is an ossification or bone fragment along the lateral right
acetabulum.  There was a small bone fragment or ossification in
this area on the CT from [KG].  This area appears larger on today's
examination but could represent the same process.  The right hip is
located and there is no evidence to suggest an acute fracture.
IMPRESSION: No acute bony abnormality within the pelvis or right hip.

Bone fragment or ossification along the lateral aspect of the right
acetabulum as described.  This could be chronic but the etiology is
uncertain.  The patient reportedly has severe right hip pain and
may benefit from an MRI to evaluate for a soft tissue injury or an
occult fracture.

## 2013-01-25 MED ORDER — HYDROCODONE-ACETAMINOPHEN 5-325 MG PO TABS: 1.0000 | ORAL_TABLET | ORAL | Status: DC | PRN

## 2013-01-25 NOTE — ED Notes (Signed)
Patient states jumped onto platform yesterday but did not make it onto platform completely and twisted right hip.  Patient complains of pain anterior and lateral right hip pain;  Patient unable to put pressure on right leg.

## 2013-01-25 NOTE — ED Provider Notes (Signed)
History     CSN: [id number]  Arrival date & time 01/25/13  1225   First MD Initiated Contact with Patient 01/25/13 1237      Chief Complaint  Patient presents with  . Hip Pain    (Consider location/radiation/quality/duration/timing/severity/associated sxs/prior treatment) Patient is a 37 y.o. male presenting with hip pain. The history is provided by the patient.  Hip Pain This is a new problem. The current episode started yesterday (getting onto platform and slipped with right thigh laterally hyperextending.). The problem has been gradually worsening. The symptoms are aggravated by walking.    Past Medical History  Diagnosis Date  . Hypertension   . Gout     History reviewed. No pertinent past surgical history.  History reviewed. No pertinent family history.  History  Substance Use Topics  . Smoking status: Never Smoker   . Smokeless tobacco: Not on file  . Alcohol Use: No      Review of Systems  Constitutional: Negative.   Gastrointestinal: Negative.   Musculoskeletal: Positive for gait problem. Negative for back pain and joint swelling.  Skin: Negative.     Allergies  Review of patient's allergies indicates no known allergies.  Home Medications   Current Outpatient Rx  Name  Route  Sig  Dispense  Refill  . amLODipine (NORVASC) 5 MG tablet   Oral   Take 5 mg by mouth daily.         . colchicine 0.6 MG tablet   Oral   Take 0.6 mg by mouth daily.         Marland Kitchen triamterene-hydrochlorothiazide (DYAZIDE) 37.5-25 MG per capsule   Oral   Take 1 capsule by mouth every morning.         Marland Kitchen HYDROcodone-acetaminophen (NORCO/VICODIN) 5-325 MG per tablet   Oral   Take 1 tablet by mouth every 4 (four) hours as needed for pain.   20 tablet   0     BP 136/91  Pulse 82  Temp(Src) 98.9 F (37.2 C) (Oral)  SpO2 100%  Physical Exam  Nursing note and vitals reviewed. Constitutional: He is oriented to person, place, and time. He appears well-developed and  well-nourished.  Musculoskeletal: He exhibits tenderness.       Right hip: He exhibits decreased range of motion, decreased strength and tenderness. He exhibits no deformity.       Legs: Neurological: He is alert and oriented to person, place, and time.  Skin: Skin is warm and dry.    ED Course  Procedures (including critical care time)  Labs Reviewed - No data to display Dg Hip Complete Right  01/25/2013  *RADIOLOGY REPORT*  Clinical Data: Twisted the right hip 1 day ago.  Pain in the anterior hip.  RIGHT HIP - COMPLETE 2+ VIEW  Comparison: Pelvic CT 09/18/2006  Findings: Single view of the pelvis and two views of the right hip were obtained.  The pelvic bony ring appears to be intact.  There is an ossification or bone fragment along the lateral right acetabulum.  There was a small bone fragment or ossification in this area on the CT from 2007.  This area appears larger on today's examination but could represent the same process.  The right hip is located and there is no evidence to suggest an acute fracture.  IMPRESSION: No acute bony abnormality within the pelvis or right hip.  Bone fragment or ossification along the lateral aspect of the right acetabulum as described.  This could be chronic but  the etiology is uncertain.  The patient reportedly has severe right hip pain and may benefit from an MRI to evaluate for a soft tissue injury or an occult fracture.   Original Report Authenticated By: Richarda Overlie, M.D.      No diagnosis found.    MDM  X-rays reviewed and report per radiologist.  Discussed with dr Luiz Blare, will see in am.         Linna Hoff, MD 01/25/13 1500

## 2015-11-30 ENCOUNTER — Encounter: Payer: Self-pay | Admitting: Gastroenterology

## 2015-11-30 DIAGNOSIS — R1032 Left lower quadrant pain: Secondary | ICD-10-CM | POA: Diagnosis not present

## 2015-11-30 DIAGNOSIS — Z0000 Encounter for general adult medical examination without abnormal findings: Secondary | ICD-10-CM | POA: Diagnosis not present

## 2015-11-30 MED FILL — CIPROFLOXACIN HCL 500 MG TA: 500 | 10 days supply | Qty: 20 | Fill #0

## 2016-01-18 ENCOUNTER — Ambulatory Visit (AMBULATORY_SURGERY_CENTER): Payer: Self-pay

## 2016-01-18 VITALS — Ht 70.0 in | Wt 242.0 lb

## 2016-01-18 DIAGNOSIS — Z86010 Personal history of colonic polyps: Secondary | ICD-10-CM

## 2016-01-18 MED ORDER — NA SULFATE-K SULFATE-MG SULF 17.5-3.13-1.6 GM/177ML PO SOLN: 1.0000 | Freq: Once | ORAL | Status: DC

## 2016-01-18 NOTE — Progress Notes (Signed)
No egg or soy allergies Not on home 02 No previous anesthesia complications No diet or weight loss meds 

## 2016-01-31 ENCOUNTER — Encounter: Payer: Self-pay | Admitting: Gastroenterology

## 2016-02-06 ENCOUNTER — Encounter: Payer: Self-pay | Admitting: Gastroenterology

## 2016-03-02 DIAGNOSIS — F339 Major depressive disorder, recurrent, unspecified: Secondary | ICD-10-CM | POA: Diagnosis not present

## 2016-03-02 DIAGNOSIS — M109 Gout, unspecified: Secondary | ICD-10-CM | POA: Diagnosis not present

## 2016-03-02 DIAGNOSIS — E785 Hyperlipidemia, unspecified: Secondary | ICD-10-CM | POA: Diagnosis not present

## 2016-03-02 DIAGNOSIS — Z6835 Body mass index (BMI) 35.0-35.9, adult: Secondary | ICD-10-CM | POA: Diagnosis not present

## 2016-03-02 DIAGNOSIS — I10 Essential (primary) hypertension: Secondary | ICD-10-CM | POA: Diagnosis not present

## 2016-03-02 MED FILL — AMLODIPINE BESYLATE 10 MG T: 10 | 90 days supply | Qty: 90 | Fill #0

## 2016-03-02 MED FILL — ULORIC 40 MG TABLET: 40 | 90 days supply | Qty: 90 | Fill #0

## 2016-03-02 MED FILL — LOSARTAN POTASSIUM 100 MG T: 100 | 90 days supply | Qty: 90 | Fill #0

## 2016-03-02 MED FILL — VENLAFAXINE HCL ER 37.5 MG: 37.5 | 90 days supply | Qty: 90 | Fill #0

## 2016-03-07 MED FILL — SUPREP BOWEL PREP KIT: 17.5-3.13-1 | 1 days supply | Qty: 354 | Fill #0

## 2016-04-25 ENCOUNTER — Encounter (INDEPENDENT_AMBULATORY_CARE_PROVIDER_SITE_OTHER): Payer: 59 | Admitting: Ophthalmology

## 2016-04-25 DIAGNOSIS — H43813 Vitreous degeneration, bilateral: Secondary | ICD-10-CM | POA: Diagnosis not present

## 2016-04-25 DIAGNOSIS — H35412 Lattice degeneration of retina, left eye: Secondary | ICD-10-CM | POA: Diagnosis not present

## 2016-04-25 DIAGNOSIS — H33032 Retinal detachment with giant retinal tear, left eye: Secondary | ICD-10-CM

## 2016-04-25 DIAGNOSIS — H35033 Hypertensive retinopathy, bilateral: Secondary | ICD-10-CM

## 2016-04-25 DIAGNOSIS — I10 Essential (primary) hypertension: Secondary | ICD-10-CM

## 2016-04-25 MED FILL — LOTEMAX 0.5% GEL: 0.5 | 30 days supply | Qty: 5 | Fill #0

## 2016-05-10 ENCOUNTER — Ambulatory Visit (INDEPENDENT_AMBULATORY_CARE_PROVIDER_SITE_OTHER): Payer: 59 | Admitting: Ophthalmology

## 2016-06-06 MED FILL — ULORIC 40 MG TABLET: 40 | 90 days supply | Qty: 90 | Fill #1

## 2016-06-06 MED FILL — VENLAFAXINE HCL ER 37.5 MG: 37.5 | 90 days supply | Qty: 90 | Fill #1

## 2016-06-06 MED FILL — AMLODIPINE BESYLATE 10 MG T: 10 | 90 days supply | Qty: 90 | Fill #1

## 2016-06-06 MED FILL — LOSARTAN POTASSIUM 100 MG T: 100 | 90 days supply | Qty: 90 | Fill #1

## 2016-07-03 ENCOUNTER — Ambulatory Visit (HOSPITAL_COMMUNITY)
Admission: EM | Admit: 2016-07-03 | Discharge: 2016-07-03 | Disposition: A | Discharge status: Home / Self Care | Payer: 59 | Attending: Internal Medicine | Admitting: Internal Medicine

## 2016-07-03 ENCOUNTER — Encounter (HOSPITAL_COMMUNITY): Payer: Self-pay | Admitting: Emergency Medicine

## 2016-07-03 DIAGNOSIS — J029 Acute pharyngitis, unspecified: Secondary | ICD-10-CM | POA: Diagnosis not present

## 2016-07-03 DIAGNOSIS — Z789 Other specified health status: Secondary | ICD-10-CM

## 2016-07-03 DIAGNOSIS — J989 Respiratory disorder, unspecified: Secondary | ICD-10-CM | POA: Diagnosis not present

## 2016-07-03 DIAGNOSIS — Z91013 Allergy to seafood: Secondary | ICD-10-CM | POA: Insufficient documentation

## 2016-07-03 DIAGNOSIS — I10 Essential (primary) hypertension: Secondary | ICD-10-CM | POA: Insufficient documentation

## 2016-07-03 DIAGNOSIS — R509 Fever, unspecified: Secondary | ICD-10-CM | POA: Diagnosis not present

## 2016-07-03 LAB — COMPREHENSIVE METABOLIC PANEL
ALBUMIN: 3.7 g/dL (ref 3.5–5.0)
ALT: 29 U/L (ref 17–63)
AST: 23 U/L (ref 15–41)
Alkaline Phosphatase: 64 U/L (ref 38–126)
Anion gap: 6 (ref 5–15)
BUN: 9 mg/dL (ref 6–20)
CHLORIDE: 104 mmol/L (ref 101–111)
CO2: 29 mmol/L (ref 22–32)
CREATININE: 1.29 mg/dL — AB (ref 0.61–1.24)
Calcium: 9.3 mg/dL (ref 8.9–10.3)
GFR calc Af Amer: >60 mL/min (ref >60)
GFR calc non Af Amer: >60 mL/min (ref >60)
Glucose, Bld: 103 mg/dL — ABNORMAL HIGH (ref 65–99)
Potassium: 3.8 mmol/L (ref 3.5–5.1)
SODIUM: 139 mmol/L (ref 135–145)
Total Bilirubin: 0.8 mg/dL (ref 0.3–1.2)
Total Protein: 7.5 g/dL (ref 6.5–8.1)

## 2016-07-03 LAB — CBC WITH DIFFERENTIAL/PLATELET
BASOS ABS: 0 10*3/uL (ref 0.0–0.1)
Basophils Relative: 0 %
EOS ABS: 0.1 10*3/uL (ref 0.0–0.7)
EOS PCT: 1 %
HCT: 42 % (ref 39.0–52.0)
Hemoglobin: 14.1 g/dL (ref 13.0–17.0)
Lymphocytes Relative: 10 %
Lymphs Abs: 1.5 10*3/uL (ref 0.7–4.0)
MCH: 27.5 pg (ref 26.0–34.0)
MCHC: 33.6 g/dL (ref 30.0–36.0)
MCV: 81.9 fL (ref 78.0–100.0)
Monocytes Absolute: 1.1 10*3/uL — ABNORMAL HIGH (ref 0.1–1.0)
Monocytes Relative: 7 %
Neutro Abs: 12.9 10*3/uL — ABNORMAL HIGH (ref 1.7–7.7)
Neutrophils Relative %: 82 %
PLATELETS: 177 10*3/uL (ref 150–400)
RBC: 5.13 MIL/uL (ref 4.22–5.81)
RDW: 15 % (ref 11.5–15.5)
WBC: 15.7 10*3/uL — AB (ref 4.0–10.5)

## 2016-07-03 LAB — POCT RAPID STREP A: STREPTOCOCCUS, GROUP A SCREEN (DIRECT): POSITIVE — AB

## 2016-07-03 MED ORDER — PENICILLIN V POTASSIUM 500 MG PO TABS: 500.0000 mg | ORAL_TABLET | Freq: Two times a day (BID) | ORAL | 0 refills | Status: DC

## 2016-07-03 MED ORDER — HYDROCODONE-ACETAMINOPHEN 5-325 MG PO TABS: 2.0000 | ORAL_TABLET | Freq: Four times a day (QID) | ORAL | 0 refills | Status: DC | PRN

## 2016-07-03 MED ORDER — PREDNISONE 50 MG PO TABS: 50.0000 mg | ORAL_TABLET | Freq: Every day | ORAL | 0 refills | Status: DC

## 2016-07-03 MED FILL — predniSONE 50 MG TABS: 50 | 3 days supply | Qty: 3 | Fill #0

## 2016-07-03 MED FILL — HYDROCODON-APAP 5-325: 5-325 | 2 days supply | Qty: 10 | Fill #0

## 2016-07-03 MED FILL — PENICILLIN VK 500 MG TABLET: 500 | 10 days supply | Qty: 20 | Fill #0

## 2016-07-03 NOTE — ED Triage Notes (Signed)
The patient presented to the [facility] with a complaint of a sore throat, fever and chills that started 3 days ago.

## 2016-07-03 NOTE — ED Provider Notes (Signed)
[place]    CSN: [id number] Arrival date & time: 07/03/16  2426  First Provider Contact:  First MD Initiated Contact with Patient 07/03/16 1035        History   Chief Complaint Chief Complaint  Patient presents with  . Sore Throat    HPI Joseph Ferguson is a 40 y.o. male. Up of days history of bad sore throat, difficulty swallowing, fever and dry cough. Symptoms worst in the last 24 hours. He just returned from a 4 day trip to [place], where he stayed on a resort. He did not have vaccines before he went. No runny/congested nose. Some headache. No nausea/vomiting, no change in bowel habits. No abdominal pain. He is achy, and is having some chills. Worst achiness in his throat. He was attributing the other aching to a 40 mile bike ride that he took 3 days ago, after his return from [place]. He is not aware of bug bites. No rash. No eye pain.  HPI  Past Medical History:  Diagnosis Date  . Allergy   . Depression   . Gout   . Hypertension     Patient Active Problem List   Diagnosis Date Noted  . HEMORRHOIDS 12/11/2007  . IRRITABLE BOWEL SYNDROME 12/11/2007  . ABDOMINAL PAIN, UNSPECIFIED SITE 12/11/2007  . COLONIC POLYPS, ADENOMATOUS 10/14/2007    Past Surgical History:  Procedure Laterality Date  . COLONOSCOPY  2012     Home Medications    Prior to Admission medications   Medication Sig Start Date End Date Taking? Authorizing Provider  amLODipine (NORVASC) 5 MG tablet Take 10 mg by mouth daily.    Yes Historical Provider, MD  febuxostat (ULORIC) 40 MG tablet Take 40 mg by mouth daily.   Yes Historical Provider, MD  losartan (COZAAR) 100 MG tablet Take 100 mg by mouth daily.   Yes Historical Provider, MD  cetirizine (ZYRTEC) 10 MG tablet Take 10 mg by mouth daily.    Historical Provider, MD  Na Sulfate-K Sulfate-Mg Sulf (SUPREP BOWEL PREP) SOLN Take 1 kit by mouth once. Name brand only, no substitutions, take as directed 01/18/16   Milus Banister, MD     Family History Family History  Problem Relation Age of Onset  . Colon cancer Maternal Grandfather     Social History Social History  Substance Use Topics  . Smoking status: Never Smoker  . Smokeless tobacco: Never Used  . Alcohol use 0.0 oz/week     Comment: rarely     Allergies   Shellfish allergy   Review of Systems Review of Systems  All other systems reviewed and are negative.   Physical Exam Triage Vital Signs ED Triage Vitals  Enc Vitals Group     BP 07/03/16 1028 134/96     Pulse Rate 07/03/16 1028 87     Resp 07/03/16 1028 18     Temp 07/03/16 1028 101.1 F (38.4 C)     Temp Source 07/03/16 1028 Oral     SpO2 07/03/16 1028 100 %     Weight --      Height --      Pain Score 07/03/16 1029 8   Updated Vital Signs BP 134/96 (BP Location: Left Arm)   Pulse 87   Temp 101.1 F (38.4 C) (Oral)   Resp 18   SpO2 100%  Physical Exam  Constitutional: He is oriented to person, place, and time.  Alert, nicely groomed Lying down on exam table. Looks ill, but  not toxic  HENT:  Head: Atraumatic.  Large red tonsils with exudate  Eyes:  Conjugate gaze, no eye redness/drainage  Neck: Neck supple.  Cardiovascular: Normal rate and regular rhythm.   Pulmonary/Chest: No respiratory distress. He has no wheezes. He has no rales.  Lungs clear, symmetric breath sounds  Abdominal: He exhibits no distension.  Musculoskeletal: Normal range of motion.  Neurological: He is alert and oriented to person, place, and time.  Skin: Skin is warm and dry. No rash noted.  No cyanosis  Nursing note and vitals reviewed.    UC Treatments / Results  Labs Results for orders placed or performed during the hospital encounter of 07/03/16  CBC with Differential  Result Value Ref Range   WBC 15.7 (H) 4.0 - 10.5 K/uL   RBC 5.13 4.22 - 5.81 MIL/uL   Hemoglobin 14.1 13.0 - 17.0 g/dL   HCT 42.0 39.0 - 52.0 %   MCV 81.9 78.0 - 100.0 fL   MCH 27.5 26.0 - 34.0 pg   MCHC 33.6 30.0 -  36.0 g/dL   RDW 15.0 11.5 - 15.5 %   Platelets 177 150 - 400 K/uL   Neutrophils Relative % 82 %   Neutro Abs 12.9 (H) 1.7 - 7.7 K/uL   Lymphocytes Relative 10 %   Lymphs Abs 1.5 0.7 - 4.0 K/uL   Monocytes Relative 7 %   Monocytes Absolute 1.1 (H) 0.1 - 1.0 K/uL   Eosinophils Relative 1 %   Eosinophils Absolute 0.1 0.0 - 0.7 K/uL   Basophils Relative 0 %   Basophils Absolute 0.0 0.0 - 0.1 K/uL  Comprehensive metabolic panel  Result Value Ref Range   Sodium 139 135 - 145 mmol/L   Potassium 3.8 3.5 - 5.1 mmol/L   Chloride 104 101 - 111 mmol/L   CO2 29 22 - 32 mmol/L   Glucose, Bld 103 (H) 65 - 99 mg/dL   BUN 9 6 - 20 mg/dL   Creatinine, Ser 1.29 (H) 0.61 - 1.24 mg/dL   Calcium 9.3 8.9 - 10.3 mg/dL   Total Protein 7.5 6.5 - 8.1 g/dL   Albumin 3.7 3.5 - 5.0 g/dL   AST 23 15 - 41 U/L   ALT 29 17 - 63 U/L   Alkaline Phosphatase 64 38 - 126 U/L   Total Bilirubin 0.8 0.3 - 1.2 mg/dL   GFR calc non Af Amer >60 >60 mL/min   GFR calc Af Amer >60 >60 mL/min   Anion gap 6 5 - 15  POCT rapid strep A [facility])  Result Value Ref Range   Streptococcus, Group A Screen (Direct) POSITIVE (A) NEGATIVE     Procedures Procedures (including critical care time)      None  Final Clinical Impressions(s) / UC Diagnoses   Final diagnoses:  Febrile respiratory illness  Recent history of foreign travel    Push fluids.  Throat culture pending, rapid strep test was negative.  CBC, CMP, urine studies pending.  Prescriptions for penicillin V and prednisone sent to the [facility] outpatient pharmacy.   Most likely source of symptoms is viral but covering empirically for strep.  Consider mono, zika, chikungunya, dengue, yellow fever if fever persists.  Recheck if not starting to improve in a few days.  New Prescriptions Discharge Medication List as of 07/03/2016 11:28 AM    START taking these medications   Details  HYDROcodone-acetaminophen (NORCO/VICODIN) 5-325 MG tablet Take 2 tablets by mouth 4  (four) times daily as needed.,  Starting Tue 07/03/2016, Print    penicillin v potassium (VEETID) 500 MG tablet Take 1 tablet (500 mg total) by mouth 2 (two) times daily., Starting Tue 07/03/2016, Normal    predniSONE (DELTASONE) 50 MG tablet Take 1 tablet (50 mg total) by mouth daily., Starting Tue 07/03/2016, Normal       Addendum:  Rapid strep incorrectly reported to patient as negative; result was positive. Message left on patient's cell phone voice mail the day of the urgent care visit to update the patient.   Sherlene Shams, MD 07/08/16 [phone]

## 2016-07-03 NOTE — Discharge Instructions (Addendum)
Push fluids.  Throat culture pending, rapid strep test was negative.  CBC, CMP, urine studies pending.  Prescriptions for penicillin V and prednisone sent to the [facility] outpatient pharmacy.   Most likely source of symptoms is viral but covering empirically for strep.  Consider mono, zika, chikungunya, dengue, yellow fever if fever persists.  Recheck if not starting to improve in a few days.

## 2016-08-29 MED FILL — VENLAFAXINE HCL ER 37.5 MG: 37.5 | 90 days supply | Qty: 90 | Fill #0

## 2016-08-29 MED FILL — LOSARTAN POTASSIUM 100 MG T: 100 | 90 days supply | Qty: 90 | Fill #0

## 2016-08-29 MED FILL — ULORIC 40 MG TABLET: 40 | 90 days supply | Qty: 90 | Fill #0

## 2016-08-29 MED FILL — AMLODIPINE BESYLATE 10 MG T: 10 | 90 days supply | Qty: 90 | Fill #0

## 2016-09-14 ENCOUNTER — Ambulatory Visit: Payer: 59 | Admitting: Family Medicine

## 2016-12-11 DIAGNOSIS — F339 Major depressive disorder, recurrent, unspecified: Secondary | ICD-10-CM | POA: Diagnosis not present

## 2016-12-11 DIAGNOSIS — I10 Essential (primary) hypertension: Secondary | ICD-10-CM | POA: Diagnosis not present

## 2016-12-11 DIAGNOSIS — E7800 Pure hypercholesterolemia, unspecified: Secondary | ICD-10-CM | POA: Diagnosis not present

## 2016-12-11 DIAGNOSIS — M109 Gout, unspecified: Secondary | ICD-10-CM | POA: Diagnosis not present

## 2016-12-11 MED FILL — LOSARTAN POTASSIUM 100 MG T: 100 | 90 days supply | Qty: 90 | Fill #0

## 2016-12-11 MED FILL — AMLODIPINE BESYLATE 10 MG T: 10 | 90 days supply | Qty: 90 | Fill #0

## 2017-01-16 MED FILL — ALLOPURINOL 300 MG TABLET: 300 | 30 days supply | Qty: 30 | Fill #0

## 2017-02-08 DIAGNOSIS — R0683 Snoring: Secondary | ICD-10-CM | POA: Diagnosis not present

## 2017-02-08 DIAGNOSIS — M109 Gout, unspecified: Secondary | ICD-10-CM | POA: Diagnosis not present

## 2017-02-13 MED FILL — ALLOPURINOL 300 MG TABLET: 300 | 30 days supply | Qty: 30 | Fill #1

## 2017-03-12 MED FILL — VENLAFAXINE HCL ER 37.5 MG: 37.5 | 90 days supply | Qty: 90 | Fill #0

## 2017-03-18 MED FILL — ALLOPURINOL 300 MG TABLET: 300 | 90 days supply | Qty: 90 | Fill #0

## 2017-06-05 MED FILL — LOSARTAN POTASSIUM 100 MG T: 100 | 90 days supply | Qty: 90 | Fill #1

## 2017-06-05 MED FILL — AMLODIPINE BESYLATE 10 MG T: 10 | 90 days supply | Qty: 90 | Fill #1

## 2017-06-05 MED FILL — VENLAFAXINE HCL ER 37.5 MG: 37.5 | 90 days supply | Qty: 90 | Fill #1

## 2017-07-23 ENCOUNTER — Ambulatory Visit: Payer: 59 | Admitting: Primary Care

## 2017-08-01 ENCOUNTER — Encounter: Payer: Self-pay | Admitting: Primary Care

## 2017-08-01 ENCOUNTER — Encounter (INDEPENDENT_AMBULATORY_CARE_PROVIDER_SITE_OTHER): Payer: Self-pay

## 2017-08-01 ENCOUNTER — Ambulatory Visit (INDEPENDENT_AMBULATORY_CARE_PROVIDER_SITE_OTHER): Payer: 59 | Admitting: Primary Care

## 2017-08-01 VITALS — BP 122/74 | HR 69 | Temp 98.0°F | Ht 69.5 in | Wt 234.0 lb

## 2017-08-01 DIAGNOSIS — I10 Essential (primary) hypertension: Secondary | ICD-10-CM | POA: Insufficient documentation

## 2017-08-01 DIAGNOSIS — M1A9XX0 Chronic gout, unspecified, without tophus (tophi): Secondary | ICD-10-CM | POA: Diagnosis not present

## 2017-08-01 DIAGNOSIS — F3342 Major depressive disorder, recurrent, in full remission: Secondary | ICD-10-CM | POA: Diagnosis not present

## 2017-08-01 DIAGNOSIS — Z113 Encounter for screening for infections with a predominantly sexual mode of transmission: Secondary | ICD-10-CM | POA: Diagnosis not present

## 2017-08-01 LAB — LIPID PANEL
CHOL/HDL RATIO: 5
Cholesterol: 185 mg/dL (ref 0–200)
HDL: 41 mg/dL (ref >39.00)
LDL CALC: 131 mg/dL — AB (ref 0–99)
NonHDL: 144.39
TRIGLYCERIDES: 68 mg/dL (ref 0.0–149.0)
VLDL: 13.6 mg/dL (ref 0.0–40.0)

## 2017-08-01 LAB — COMPREHENSIVE METABOLIC PANEL
ALT: 32 U/L (ref 0–53)
AST: 19 U/L (ref 0–37)
Albumin: 4.3 g/dL (ref 3.5–5.2)
Alkaline Phosphatase: 57 U/L (ref 39–117)
BUN: 16 mg/dL (ref 6–23)
CHLORIDE: 103 meq/L (ref 96–112)
CO2: 32 meq/L (ref 19–32)
CREATININE: 1.21 mg/dL (ref 0.40–1.50)
Calcium: 9.8 mg/dL (ref 8.4–10.5)
GFR: 84.72 mL/min (ref >60.00)
GLUCOSE: 95 mg/dL (ref 70–99)
Potassium: 4.4 mEq/L (ref 3.5–5.1)
SODIUM: 140 meq/L (ref 135–145)
Total Bilirubin: 0.6 mg/dL (ref 0.2–1.2)
Total Protein: 8 g/dL (ref 6.0–8.3)

## 2017-08-01 LAB — URIC ACID: URIC ACID, SERUM: 4.3 mg/dL (ref 4.0–7.8)

## 2017-08-01 MED ORDER — VENLAFAXINE HCL 37.5 MG PO TABS: 37.5000 mg | ORAL_TABLET | Freq: Every day | ORAL | 3 refills | Status: DC

## 2017-08-01 MED ORDER — AMLODIPINE BESYLATE 10 MG PO TABS: 10.0000 mg | ORAL_TABLET | Freq: Every day | ORAL | 3 refills | Status: DC

## 2017-08-01 MED ORDER — INDOMETHACIN ER 75 MG PO CPCR: 75.0000 mg | ORAL_CAPSULE | Freq: Every day | ORAL | 3 refills | Status: DC

## 2017-08-01 MED ORDER — LOSARTAN POTASSIUM 100 MG PO TABS: 100.0000 mg | ORAL_TABLET | Freq: Every day | ORAL | 3 refills | Status: DC

## 2017-08-01 MED FILL — INDOMETHACIN ER 75 MG CAP: 75 | 90 days supply | Qty: 90 | Fill #0

## 2017-08-01 NOTE — Patient Instructions (Signed)
I sent refills of your medications to the pharmacy.   Complete lab work prior to leaving today. I will notify you of your results once received.   It was a pleasure to meet you today! Please don't hesitate to call me with any questions. Welcome to [facility]!

## 2017-08-01 NOTE — Assessment & Plan Note (Addendum)
Using Indomethacin daily for prevention, doing well. Check renal function and uric acid level today.

## 2017-08-01 NOTE — Assessment & Plan Note (Signed)
Stable in the office today, continue Amlodipine and Losartan. CMP pending. Refills sent to pharmacy.

## 2017-08-01 NOTE — Progress Notes (Signed)
Subjective:    Patient ID: Joseph Ferguson, male    DOB: 1976-08-14, 41 y.o.   MRN: [id number]  HPI  Joseph Ferguson is a 40 year old male who presents today to establish care and discuss the problems mentioned below. Will obtain old records.  1) Essential Hypertension: Currently managed on amlodipine 10 mg and losartan 100 mg. His blood pressure in the office today is 122/74. He is needing refills. Denies chest pain, shortness of breath, headaches.   2) Depression: Diagnosed over 10 years ago. Currently managed on Effexor 37.5 mg for the past 5 years. Overall feels well managed. He experienced side effects from Zoloft in the past.   3) Chronic Gout: Diagnosed 4 years ago. Previously managed on Uloric, had to come off due to price, now taking Indomethacin once daily. Symptoms are typically noticeable to his great toes. Overall doing well on indomethacin. No major flares for 3 years.   4) Family History of Colon Cancer: History of two colonoscopy procedures in the past, one polyp that was not precancerous. Will get repeat colonoscopy every 3 years given maternal grandfather history colon cancer.    5) STD: Unprotected intercourse and is in same monogamous relationship. Would like STD testing. Denies symptoms of penile discharge, urinary frequency/urgency/hematuria, testicular pain.  Review of Systems  Eyes: Negative for visual disturbance.  Respiratory: Negative for shortness of breath.   Cardiovascular: Negative for chest pain.  Genitourinary: Negative for discharge, dysuria, frequency and testicular pain.  Musculoskeletal:       Chronic gout  Neurological: Negative for dizziness and headaches.       Past Medical History:  Diagnosis Date  . Allergy   . Depression   . Gout   . Hypertension      Social History   Social History  . Marital status: Legally Separated    Spouse name: N/A  . Number of children: N/A  . Years of education: N/A   Occupational History  . Not on file.    Social History Main Topics  . Smoking status: Never Smoker  . Smokeless tobacco: Never Used  . Alcohol use 0.0 oz/week     Comment: rarely  . Drug use: No  . Sexual activity: Yes    Birth control/ protection: None   Other Topics Concern  . Not on file   Social History Narrative   Divorced.   2 children.   CRNA, works for [employer].    Enjoys spending time with children, relaxing.     Past Surgical History:  Procedure Laterality Date  . COLONOSCOPY  2012    Family History  Problem Relation Age of Onset  . Colon cancer Maternal Grandfather 90  . Hypertension Mother   . Hypertension Father     Allergies  Allergen Reactions  . Shellfish Allergy Swelling    No current outpatient prescriptions on file prior to visit.   No current facility-administered medications on file prior to visit.     BP 122/74 (BP Location: Right Arm, Patient Position: Sitting)   Pulse 69   Temp 98 F (36.7 C) (Oral)   Ht 5' 9.5" (1.765 m)   Wt 234 lb (106.1 kg)   SpO2 96%   BMI 34.06 kg/m    Objective:   Physical Exam  Constitutional: He is oriented to person, place, and time. He appears well-nourished.  Neck: Neck supple.  Cardiovascular: Normal rate and regular rhythm.   Pulmonary/Chest: Effort normal and breath sounds normal. He has no wheezes.  He has no rales.  Neurological: He is alert and oriented to person, place, and time.  Skin: Skin is warm and dry.  Psychiatric: He has a normal mood and affect.          Assessment & Plan:

## 2017-08-01 NOTE — Assessment & Plan Note (Signed)
Stable on Effexor, continue same. Refills sent to pharmacy.

## 2017-08-02 ENCOUNTER — Other Ambulatory Visit: Payer: Self-pay | Admitting: Primary Care

## 2017-08-02 DIAGNOSIS — E785 Hyperlipidemia, unspecified: Secondary | ICD-10-CM

## 2017-08-02 LAB — HSV(HERPES SIMPLEX VRS) I + II AB-IGG
HSV 1 IGG, TYPE SPEC: 4.71 {index} — AB
HSV 2 IGG,TYPE SPECIFIC AB: >23 index — ABNORMAL HIGH

## 2017-08-02 LAB — C. TRACHOMATIS/N. GONORRHOEAE RNA
C. TRACHOMATIS RNA, TMA: NOT DETECTED
N. gonorrhoeae RNA, TMA: NOT DETECTED

## 2017-08-02 LAB — RPR: RPR: NONREACTIVE

## 2017-08-02 LAB — HIV ANTIBODY (ROUTINE TESTING W REFLEX): HIV 1&2 Ab, 4th Generation: NONREACTIVE

## 2017-08-06 LAB — HSV 1/2 AB (IGM), IFA W/RFLX TITER
HSV 1 IGM SCREEN: NEGATIVE
HSV 2 IgM Screen: NEGATIVE

## 2017-08-16 MED FILL — AMLODIPINE BESYLATE 10 MG T: 10 | 90 days supply | Qty: 90 | Fill #0

## 2017-09-13 ENCOUNTER — Telehealth: Payer: Self-pay | Admitting: Primary Care

## 2017-09-13 DIAGNOSIS — F3342 Major depressive disorder, recurrent, in full remission: Secondary | ICD-10-CM

## 2017-09-13 MED ORDER — VENLAFAXINE HCL ER 37.5 MG PO CP24: 37.5000 mg | ORAL_CAPSULE | Freq: Every day | ORAL | 1 refills | Status: DC

## 2017-09-13 MED FILL — LOSARTAN POTASSIUM 100 MG T: 100 | 90 days supply | Qty: 90 | Fill #0

## 2017-09-13 MED FILL — VENLAFAXINE HCL ER 37.5 MG: 37.5 | 90 days supply | Qty: 90 | Fill #0

## 2017-09-13 NOTE — Telephone Encounter (Signed)
Called the [facility] pharmacy as instructed.

## 2017-09-13 NOTE — Telephone Encounter (Signed)
See message.

## 2017-09-13 NOTE — Telephone Encounter (Signed)
Will send extended release as he's been taking this for some time. Please call pharmacy to cancel immediate release.

## 2017-09-13 NOTE — Telephone Encounter (Signed)
Copied from [place] [id number]. Topic: Quick Communication - See Telephone Encounter >> Sep 13, 2017  2:30 PM Arletha Grippe wrote: CRM for notification. See Telephone encounter for:   Pharmacy called - they want to clarify that the provider wants to write for venlafaxine (EFFEXOR) 37.5 MG tablet immediate release and not the extended release.  The pt has had the extended release in the past.   Cb number is [email or web address]

## 2017-12-09 MED FILL — VENLAFAXINE HCL ER 37.5 MG: 37.5 | 90 days supply | Qty: 90 | Fill #1

## 2017-12-13 MED FILL — AMLODIPINE BESYLATE 10 MG T: 10 | 90 days supply | Qty: 90 | Fill #1

## 2017-12-13 MED FILL — LOSARTAN POTASSIUM 100 MG T: 100 | 90 days supply | Qty: 90 | Fill #1

## 2018-03-31 ENCOUNTER — Other Ambulatory Visit: Payer: Self-pay | Admitting: Primary Care

## 2018-03-31 DIAGNOSIS — F3342 Major depressive disorder, recurrent, in full remission: Secondary | ICD-10-CM

## 2018-03-31 MED FILL — VENLAFAXINE HCL ER 37.5 MG: 37.5 | 90 days supply | Qty: 90 | Fill #0

## 2018-05-08 ENCOUNTER — Ambulatory Visit (INDEPENDENT_AMBULATORY_CARE_PROVIDER_SITE_OTHER): Payer: 59 | Admitting: Primary Care

## 2018-05-08 ENCOUNTER — Encounter: Payer: Self-pay | Admitting: Primary Care

## 2018-05-08 VITALS — BP 114/82 | HR 84 | Temp 98.5°F | Ht 69.5 in | Wt 246.2 lb

## 2018-05-08 DIAGNOSIS — M1A9XX0 Chronic gout, unspecified, without tophus (tophi): Secondary | ICD-10-CM | POA: Diagnosis not present

## 2018-05-08 DIAGNOSIS — F3342 Major depressive disorder, recurrent, in full remission: Secondary | ICD-10-CM | POA: Diagnosis not present

## 2018-05-08 DIAGNOSIS — Z113 Encounter for screening for infections with a predominantly sexual mode of transmission: Secondary | ICD-10-CM | POA: Diagnosis not present

## 2018-05-08 DIAGNOSIS — I10 Essential (primary) hypertension: Secondary | ICD-10-CM

## 2018-05-08 LAB — COMPREHENSIVE METABOLIC PANEL
ALBUMIN: 4.4 g/dL (ref 3.5–5.2)
ALT: 23 U/L (ref 0–53)
AST: 17 U/L (ref 0–37)
Alkaline Phosphatase: 57 U/L (ref 39–117)
BUN: 15 mg/dL (ref 6–23)
CALCIUM: 9.7 mg/dL (ref 8.4–10.5)
CHLORIDE: 103 meq/L (ref 96–112)
CO2: 31 mEq/L (ref 19–32)
Creatinine, Ser: 1.31 mg/dL (ref 0.40–1.50)
GFR: 77.01 mL/min (ref >60.00)
Glucose, Bld: 110 mg/dL — ABNORMAL HIGH (ref 70–99)
POTASSIUM: 4.6 meq/L (ref 3.5–5.1)
Sodium: 140 mEq/L (ref 135–145)
Total Bilirubin: 0.5 mg/dL (ref 0.2–1.2)
Total Protein: 8.1 g/dL (ref 6.0–8.3)

## 2018-05-08 LAB — URIC ACID: URIC ACID, SERUM: 5.9 mg/dL (ref 4.0–7.8)

## 2018-05-08 MED ORDER — ALLOPURINOL 100 MG PO TABS: 100.0000 mg | ORAL_TABLET | Freq: Every day | ORAL | 3 refills | Status: DC

## 2018-05-08 MED ORDER — VENLAFAXINE HCL ER 37.5 MG PO CP24: 37.5000 mg | ORAL_CAPSULE | Freq: Every day | ORAL | 3 refills | Status: DC

## 2018-05-08 MED FILL — ALLOPURINOL 100 MG TABLET: 100 | 90 days supply | Qty: 90 | Fill #0

## 2018-05-08 NOTE — Patient Instructions (Addendum)
Stop by the lab prior to leaving today. I will notify you of your results once received.   Start allopurinol 100 mg tablets for chronic gout. Take 1 tablet by mouth once daily. Please update me in a few weeks if no improvement.   Continue all other medications.  It was a pleasure to see you today!

## 2018-05-08 NOTE — Progress Notes (Signed)
Subjective:    Patient ID: Joseph Ferguson, male    DOB: [birth year], 42 y.o.   MRN: [id number]  HPI  Joseph Ferguson is a 42 year old male who presents today for follow up and to discuss chronic issues. He would also like STD testing, he has no symptoms.  1) Chronic Gout: Currently managed on indomethacin SR 75 mg tablets for which he takes as needed for inflammation, averaging once weekly. Symptoms are located mostly to the right great toe and are constant daily, but will flare with any alcohol consumption and mild gym workouts. He was managed on Uloric in the past with good results but could not continue to afford. He was then managed on Allopurinol 100 mg daily with improvement as well, he'd like to go back on allopurinol in order to eliminate discomfort.   He denies an acute flare now.   2) Essential Hypertension: Currently managed on Amlodipine 10 mg and Losartan 100 mg. He denies chest pain, dizziness.   BP Readings from Last 3 Encounters:  05/08/18 114/82  08/01/17 122/74  07/03/16 134/96   3) Major Depressive Disorder: Currently managed on Effexor XR 37.5 mg. He denies SI/HI. He feels well managed.   Review of Systems  Respiratory: Negative for shortness of breath.   Cardiovascular: Negative for chest pain.  Genitourinary: Negative for discharge, dysuria, frequency, penile pain and testicular pain.  Musculoskeletal:       Chronic right great toe pain  Skin: Negative for color change.  Psychiatric/Behavioral:       Feels well managed on Effexor       Past Medical History:  Diagnosis Date  . Allergy   . Depression   . Gout   . Hypertension      Social History   Socioeconomic History  . Marital status: Legally Separated    Spouse name: Not on file  . Number of children: Not on file  . Years of education: Not on file  . Highest education level: Not on file  Occupational History  . Not on file  Social Needs  . Financial resource strain: Not on file  . Food  insecurity:    Worry: Not on file    Inability: Not on file  . Transportation needs:    Medical: Not on file    Non-medical: Not on file  Tobacco Use  . Smoking status: Never Smoker  . Smokeless tobacco: Never Used  Substance and Sexual Activity  . Alcohol use: Yes    Alcohol/week: 0.0 oz    Comment: rarely  . Drug use: No  . Sexual activity: Yes    Birth control/protection: None  Lifestyle  . Physical activity:    Days per week: Not on file    Minutes per session: Not on file  . Stress: Not on file  Relationships  . Social connections:    Talks on phone: Not on file    Gets together: Not on file    Attends religious service: Not on file    Active member of club or organization: Not on file    Attends meetings of clubs or organizations: Not on file    Relationship status: Not on file  . Intimate partner violence:    Fear of current or ex partner: Not on file    Emotionally abused: Not on file    Physically abused: Not on file    Forced sexual activity: Not on file  Other Topics Concern  . Not on file  Social History Narrative   Divorced.   2 children.   CRNA, works for [employer].    Enjoys spending time with children, relaxing.     Past Surgical History:  Procedure Laterality Date  . COLONOSCOPY  2012    Family History  Problem Relation Age of Onset  . Colon cancer Maternal Grandfather 90  . Hypertension Mother   . Hypertension Father     Allergies  Allergen Reactions  . Shellfish Allergy Swelling    Current Outpatient Medications on File Prior to Visit  Medication Sig Dispense Refill  . amLODipine (NORVASC) 10 MG tablet Take 1 tablet (10 mg total) by mouth daily. 90 tablet 3  . indomethacin (INDOCIN SR) 75 MG CR capsule Take 1 capsule (75 mg total) by mouth daily. 90 capsule 3  . losartan (COZAAR) 100 MG tablet Take 1 tablet (100 mg total) by mouth daily. 90 tablet 3  . venlafaxine XR (EFFEXOR-XR) 37.5 MG 24 hr capsule Take 1 capsule (37.5 mg total) by  mouth daily with breakfast. WILL NEED APPOINTMENT FOR ANY MORE REFILLS 90 capsule 0   No current facility-administered medications on file prior to visit.     BP 114/82   Pulse 84   Temp 98.5 F (36.9 C) (Oral)   Ht 5' 9.5" (1.765 m)   Wt 246 lb 4 oz (111.7 kg)   SpO2 98%   BMI 35.84 kg/m    Objective:   Physical Exam  Constitutional: He appears well-nourished.  Neck: Neck supple.  Cardiovascular: Normal rate and regular rhythm.  Respiratory: Effort normal and breath sounds normal.  Skin: Skin is warm and dry.           Assessment & Plan:

## 2018-05-08 NOTE — Assessment & Plan Note (Signed)
Doing well on Effexor, continue same. Refills sent to pharmacy.

## 2018-05-08 NOTE — Assessment & Plan Note (Signed)
Stable in the office today, continue current regimen. BMP pending. 

## 2018-05-08 NOTE — Assessment & Plan Note (Signed)
Once managed on allopurinol and did well, would like to resume. Will check CMP today to ensure kidney function is stable.   Rx for Allopurinol 100 mg sent to pharmacy, he will update if no improvement. He has no acute flare today.

## 2018-05-14 LAB — C. TRACHOMATIS/N. GONORRHOEAE RNA
C. trachomatis RNA, TMA: NOT DETECTED
N. gonorrhoeae RNA, TMA: NOT DETECTED

## 2018-05-14 LAB — HEPATITIS C ANTIBODY
Hepatitis C Ab: NONREACTIVE
SIGNAL TO CUT-OFF: 0.13 (ref <1.00)

## 2018-05-14 LAB — RPR: RPR Ser Ql: NONREACTIVE

## 2018-05-14 LAB — HSV 1/2 AB (IGM), IFA W/RFLX TITER
HSV 1 IgM Screen: NEGATIVE
HSV 2 IgM Screen: NEGATIVE

## 2018-05-14 LAB — HIV ANTIBODY (ROUTINE TESTING W REFLEX): HIV 1&2 Ab, 4th Generation: NONREACTIVE

## 2018-05-14 LAB — HSV(HERPES SIMPLEX VRS) I + II AB-IGG
HSV 1 IGG, TYPE SPEC: 4.84 {index} — AB
HSV 2 IGG,TYPE SPECIFIC AB: >23 index — ABNORMAL HIGH

## 2018-05-28 MED FILL — LOSARTAN POTASSIUM 100 MG T: 100 | 90 days supply | Qty: 90 | Fill #2

## 2018-05-28 MED FILL — AMLODIPINE BESYLATE 10 MG T: 10 | 90 days supply | Qty: 90 | Fill #2

## 2018-07-21 MED FILL — VENLAFAXINE HCL ER 37.5 MG: 37.5 | 90 days supply | Qty: 90 | Fill #0

## 2018-08-26 MED FILL — ALLOPURINOL 100 MG TABLET: 100 | 90 days supply | Qty: 90 | Fill #1

## 2018-09-23 ENCOUNTER — Other Ambulatory Visit: Payer: Self-pay | Admitting: Primary Care

## 2018-09-23 DIAGNOSIS — I10 Essential (primary) hypertension: Secondary | ICD-10-CM

## 2018-09-24 MED FILL — LOSARTAN POTASSIUM 100 MG T: 100 | 90 days supply | Qty: 90 | Fill #0

## 2018-09-24 MED FILL — AMLODIPINE BESYLATE 10 MG T: 10 | 90 days supply | Qty: 90 | Fill #0

## 2018-09-26 DIAGNOSIS — H52223 Regular astigmatism, bilateral: Secondary | ICD-10-CM | POA: Diagnosis not present

## 2018-10-14 MED FILL — VENLAFAXINE HCL ER 37.5 MG: 37.5 | 90 days supply | Qty: 90 | Fill #1

## 2018-12-09 MED FILL — ALLOPURINOL 100 MG TABS: 100 | 90 days supply | Qty: 90 | Fill #2

## 2018-12-18 ENCOUNTER — Ambulatory Visit (INDEPENDENT_AMBULATORY_CARE_PROVIDER_SITE_OTHER): Payer: 59 | Admitting: Primary Care

## 2018-12-18 ENCOUNTER — Encounter: Payer: Self-pay | Admitting: Primary Care

## 2018-12-18 VITALS — BP 120/86 | HR 77 | Temp 99.1°F | Ht 69.5 in | Wt 258.8 lb

## 2018-12-18 DIAGNOSIS — R0683 Snoring: Secondary | ICD-10-CM | POA: Diagnosis not present

## 2018-12-18 DIAGNOSIS — M1A9XX0 Chronic gout, unspecified, without tophus (tophi): Secondary | ICD-10-CM | POA: Diagnosis not present

## 2018-12-18 DIAGNOSIS — Z1211 Encounter for screening for malignant neoplasm of colon: Secondary | ICD-10-CM | POA: Diagnosis not present

## 2018-12-18 DIAGNOSIS — F3342 Major depressive disorder, recurrent, in full remission: Secondary | ICD-10-CM

## 2018-12-18 DIAGNOSIS — D126 Benign neoplasm of colon, unspecified: Secondary | ICD-10-CM

## 2018-12-18 DIAGNOSIS — I10 Essential (primary) hypertension: Secondary | ICD-10-CM | POA: Diagnosis not present

## 2018-12-18 DIAGNOSIS — Z0000 Encounter for general adult medical examination without abnormal findings: Secondary | ICD-10-CM

## 2018-12-18 LAB — BASIC METABOLIC PANEL
BUN: 18 mg/dL (ref 6–23)
CO2: 31 mEq/L (ref 19–32)
Calcium: 9.9 mg/dL (ref 8.4–10.5)
Chloride: 105 mEq/L (ref 96–112)
Creatinine, Ser: 1.34 mg/dL (ref 0.40–1.50)
GFR: 70.38 mL/min (ref >60.00)
GLUCOSE: 97 mg/dL (ref 70–99)
Potassium: 4 mEq/L (ref 3.5–5.1)
Sodium: 141 mEq/L (ref 135–145)

## 2018-12-18 LAB — URIC ACID: Uric Acid, Serum: 6.2 mg/dL (ref 4.0–7.8)

## 2018-12-18 LAB — LIPID PANEL
Cholesterol: 163 mg/dL (ref 0–200)
HDL: 37.8 mg/dL — ABNORMAL LOW (ref >39.00)
LDL CALC: 107 mg/dL — AB (ref 0–99)
NonHDL: 124.79
Total CHOL/HDL Ratio: 4
Triglycerides: 90 mg/dL (ref 0.0–149.0)
VLDL: 18 mg/dL (ref 0.0–40.0)

## 2018-12-18 LAB — HEMOGLOBIN A1C: Hgb A1c MFr Bld: 5.8 % (ref 4.6–6.5)

## 2018-12-18 MED ORDER — AMLODIPINE BESYLATE 10 MG PO TABS: 10.0000 mg | ORAL_TABLET | Freq: Every day | ORAL | 3 refills | Status: DC

## 2018-12-18 MED ORDER — VENLAFAXINE HCL ER 37.5 MG PO CP24: 37.5000 mg | ORAL_CAPSULE | Freq: Every day | ORAL | 3 refills | Status: DC

## 2018-12-18 MED ORDER — ALLOPURINOL 100 MG PO TABS: 100.0000 mg | ORAL_TABLET | Freq: Every day | ORAL | 3 refills | Status: DC

## 2018-12-18 MED ORDER — LOSARTAN POTASSIUM 100 MG PO TABS: 100.0000 mg | ORAL_TABLET | Freq: Every day | ORAL | 3 refills | Status: DC

## 2018-12-18 MED FILL — LOSARTAN POTASSIUM 100 MG T: 100 | 90 days supply | Qty: 90 | Fill #0

## 2018-12-18 MED FILL — AMLODIPINE BESYLATE 10 MG T: 10 | 90 days supply | Qty: 90 | Fill #0

## 2018-12-18 NOTE — Assessment & Plan Note (Signed)
Immunizations UTD. Colonoscopy due, referral placed to GI. Recommended to continue with regular exercise, work on diet. Exam unremarkable. Labs pending. Follow up in 1 year for CPE.

## 2018-12-18 NOTE — Patient Instructions (Signed)
Continue exercising. You should be getting 150 minutes of moderate intensity exercise weekly.  It's important to improve your diet by reducing consumption of fast food, fried food, processed snack foods, sugary drinks. Increase consumption of fresh vegetables and fruits, whole grains, water.  Ensure you are drinking 64 ounces of water daily.  Stop by the lab prior to leaving today. I will notify you of your results once received.   You will be contacted regarding your referral to GI for the colonoscopy and pulmonology for the sleep study.  Please let us know if you have not been contacted within one week.   I sent refills to your pharmacy.  We will see you next year or sooner if needed. It was a pleasure to see you today!   Preventive Care 40-64 Years, Male Preventive care refers to lifestyle choices and visits with your health care provider that can promote health and wellness. What does preventive care include?   A yearly physical exam. This is also called an annual well check.  Dental exams once or twice a year.  Routine eye exams. Ask your health care provider how often you should have your eyes checked.  Personal lifestyle choices, including: ? Daily care of your teeth and gums. ? Regular physical activity. ? Eating a healthy diet. ? Avoiding tobacco and drug use. ? Limiting alcohol use. ? Practicing safe sex. ? Taking low-dose aspirin every day starting at age 20. What happens during an annual well check? The services and screenings done by your health care provider during your annual well check will depend on your age, overall health, lifestyle risk factors, and family history of disease. Counseling Your health care provider may ask you questions about your:  Alcohol use.  Tobacco use.  Drug use.  Emotional well-being.  Home and relationship well-being.  Sexual activity.  Eating habits.  Work and work Statistician. Screening You may have the following  tests or measurements:  Height, weight, and BMI.  Blood pressure.  Lipid and cholesterol levels. These may be checked every 5 years, or more frequently if you are over 78 years old.  Skin check.  Lung cancer screening. You may have this screening every year starting at age 48 if you have a 30-pack-year history of smoking and currently smoke or have quit within the past 15 years.  Colorectal cancer screening. All adults should have this screening starting at age 76 and continuing until age 77. Your health care provider may recommend screening at age 56. You will have tests every 1-10 years, depending on your results and the type of screening test. People at increased risk should start screening at an earlier age. Screening tests may include: ? Guaiac-based fecal occult blood testing. ? Fecal immunochemical test (FIT). ? Stool DNA test. ? Virtual colonoscopy. ? Sigmoidoscopy. During this test, a flexible tube with a tiny camera (sigmoidoscope) is used to examine your rectum and lower colon. The sigmoidoscope is inserted through your anus into your rectum and lower colon. ? Colonoscopy. During this test, a long, thin, flexible tube with a tiny camera (colonoscope) is used to examine your entire colon and rectum.  Prostate cancer screening. Recommendations will vary depending on your family history and other risks.  Hepatitis C blood test.  Hepatitis B blood test.  Sexually transmitted disease (STD) testing.  Diabetes screening. This is done by checking your blood sugar (glucose) after you have not eaten for a while (fasting). You may have this done every 1-3 years. Discuss  your test results, treatment options, and if necessary, the need for more tests with your health care provider. Vaccines Your health care provider may recommend certain vaccines, such as:  Influenza vaccine. This is recommended every year.  Tetanus, diphtheria, and acellular pertussis (Tdap, Td) vaccine. You may  need a Td booster every 10 years.  Varicella vaccine. You may need this if you have not been vaccinated.  Zoster vaccine. You may need this after age 17.  Measles, mumps, and rubella (MMR) vaccine. You may need at least one dose of MMR if you were born in 1957 or later. You may also need a second dose.  Pneumococcal 13-valent conjugate (PCV13) vaccine. You may need this if you have certain conditions and have not been vaccinated.  Pneumococcal polysaccharide (PPSV23) vaccine. You may need one or two doses if you smoke cigarettes or if you have certain conditions.  Meningococcal vaccine. You may need this if you have certain conditions.  Hepatitis A vaccine. You may need this if you have certain conditions or if you travel or work in places where you may be exposed to hepatitis A.  Hepatitis B vaccine. You may need this if you have certain conditions or if you travel or work in places where you may be exposed to hepatitis B.  Haemophilus influenzae type b (Hib) vaccine. You may need this if you have certain risk factors. Talk to your health care provider about which screenings and vaccines you need and how often you need them. This information is not intended to replace advice given to you by your health care provider. Make sure you discuss any questions you have with your health care provider. Document Released: 11/11/2015 Document Revised: 12/05/2017 Document Reviewed: 08/16/2015 Elsevier Interactive Patient Education  2019 [employer].

## 2018-12-18 NOTE — Assessment & Plan Note (Signed)
Overdue for repeat colonoscopy, referral placed to GI.

## 2018-12-18 NOTE — Progress Notes (Signed)
Subjective:    Patient ID: Joseph Ferguson, male    DOB: [birth year], 43 y.o.   MRN: [id number]  HPI  Joseph Ferguson is a 43 year old male who presents today for complete physical.  BP Readings from Last 3 Encounters:  12/18/18 120/86  05/08/18 114/82  08/01/17 122/74     Immunizations: -Tetanus: Unsure, believes it's been within 10 years -Influenza: Completed this season   Diet: He endorses a fair diet Breakfast: Fruit, protein shake Lunch: Fruit, protein shake Dinner: Meat, starch, some vegetables  Snacks: Fruit, nuts, peanut butter, protein shake Desserts: 1-7 days weekly Beverages: Water, protein shakes  Exercise: She is exercising three times weekly at the gym Eye exam: Completed in 2019 Dental exam: Completes semi-annually  Colonoscopy: Completed last in 2012, history of adenomatous polyps. Was told to return 5 years later.     Review of Systems  Constitutional: Negative for unexpected weight change.  HENT: Negative for rhinorrhea.        Snoring. Does wake up during the night after four hours, doesn't feel short of breath. Daytime fatigue over the last one month. Chronic enlarged tonsils.   Respiratory: Negative for cough and shortness of breath.   Cardiovascular: Negative for chest pain.  Gastrointestinal: Negative for constipation and diarrhea.  Genitourinary: Negative for difficulty urinating.  Musculoskeletal: Negative for arthralgias.  Skin: Negative for rash.  Allergic/Immunologic: Negative for environmental allergies.  Neurological: Negative for dizziness, numbness and headaches.  Psychiatric/Behavioral:       Feels well managed on Effexor       Past Medical History:  Diagnosis Date  . Allergy   . Depression   . Gout   . Hypertension      Social History   Socioeconomic History  . Marital status: Legally Separated    Spouse name: Not on file  . Number of children: Not on file  . Years of education: Not on file  . Highest education level:  Not on file  Occupational History  . Not on file  Social Needs  . Financial resource strain: Not on file  . Food insecurity:    Worry: Not on file    Inability: Not on file  . Transportation needs:    Medical: Not on file    Non-medical: Not on file  Tobacco Use  . Smoking status: Never Smoker  . Smokeless tobacco: Never Used  Substance and Sexual Activity  . Alcohol use: Yes    Alcohol/week: 0.0 standard drinks    Comment: rarely  . Drug use: No  . Sexual activity: Yes    Birth control/protection: None  Lifestyle  . Physical activity:    Days per week: Not on file    Minutes per session: Not on file  . Stress: Not on file  Relationships  . Social connections:    Talks on phone: Not on file    Gets together: Not on file    Attends religious service: Not on file    Active member of club or organization: Not on file    Attends meetings of clubs or organizations: Not on file    Relationship status: Not on file  . Intimate partner violence:    Fear of current or ex partner: Not on file    Emotionally abused: Not on file    Physically abused: Not on file    Forced sexual activity: Not on file  Other Topics Concern  . Not on file  Social History Narrative  Divorced.   2 children.   CRNA, works for [employer].    Enjoys spending time with children, relaxing.     Past Surgical History:  Procedure Laterality Date  . COLONOSCOPY  2012    Family History  Problem Relation Age of Onset  . Colon cancer Maternal Grandfather 90  . Hypertension Mother   . Hypertension Father     Allergies  Allergen Reactions  . Shellfish Allergy Swelling    Current Outpatient Medications on File Prior to Visit  Medication Sig Dispense Refill  . indomethacin (INDOCIN SR) 75 MG CR capsule Take 1 capsule (75 mg total) by mouth daily. 90 capsule 3   No current facility-administered medications on file prior to visit.     BP 120/86   Pulse 77   Temp 99.1 F (37.3 C) (Oral)   Ht 5'  9.5" (1.765 m)   Wt 258 lb 12 oz (117.4 kg)   SpO2 98%   BMI 37.66 kg/m    Objective:   Physical Exam  Constitutional: He is oriented to person, place, and time. He appears well-nourished.  HENT:  Mouth/Throat: No oropharyngeal exudate.  Naturally enlarged tonsils, long uvula.  Eyes: Pupils are equal, round, and reactive to light. EOM are normal.  Neck: Neck supple. No thyromegaly present.  Cardiovascular: Normal rate and regular rhythm.  Respiratory: Effort normal and breath sounds normal.  GI: Soft. Bowel sounds are normal. There is no abdominal tenderness.  Musculoskeletal: Normal range of motion.  Neurological: He is alert and oriented to person, place, and time.  Skin: Skin is warm and dry.  Psychiatric: He has a normal mood and affect.           Assessment & Plan:

## 2018-12-18 NOTE — Assessment & Plan Note (Signed)
Repeat uric acid pending, continue allopurinol 100 mg daily and indomethacin PRN.

## 2018-12-18 NOTE — Assessment & Plan Note (Signed)
Stable in the office today, continue Amlodipine 10 mg and Losartan 100 mg daily.

## 2018-12-18 NOTE — Assessment & Plan Note (Signed)
Chronic, also waking during the night.  Epworth Sleepiness Scale score of 18 today. Referral placed to pulmonology for evaluation.

## 2018-12-18 NOTE — Assessment & Plan Note (Signed)
Doing well on venlafaxine 37.5 mg, continue same. Refills sent to pharmacy.

## 2018-12-22 ENCOUNTER — Other Ambulatory Visit: Payer: Self-pay | Admitting: Primary Care

## 2018-12-22 DIAGNOSIS — M1A9XX0 Chronic gout, unspecified, without tophus (tophi): Secondary | ICD-10-CM

## 2018-12-22 MED ORDER — ALLOPURINOL 100 MG PO TABS: 200.0000 mg | ORAL_TABLET | Freq: Every day | ORAL | 3 refills | Status: DC

## 2019-01-12 MED FILL — VENLAFAXINE HCL ER 37.5 MG: 37.5 | 90 days supply | Qty: 90 | Fill #2

## 2019-01-29 ENCOUNTER — Other Ambulatory Visit: Payer: Self-pay | Admitting: Primary Care

## 2019-01-29 DIAGNOSIS — M1A9XX0 Chronic gout, unspecified, without tophus (tophi): Secondary | ICD-10-CM

## 2019-02-02 ENCOUNTER — Other Ambulatory Visit: Payer: Self-pay

## 2019-02-02 ENCOUNTER — Other Ambulatory Visit (INDEPENDENT_AMBULATORY_CARE_PROVIDER_SITE_OTHER): Payer: 59

## 2019-02-02 DIAGNOSIS — M1A9XX0 Chronic gout, unspecified, without tophus (tophi): Secondary | ICD-10-CM | POA: Diagnosis not present

## 2019-02-02 LAB — URIC ACID: Uric Acid, Serum: 6 mg/dL (ref 4.0–7.8)

## 2019-02-04 ENCOUNTER — Encounter: Payer: Self-pay | Admitting: Pulmonary Disease

## 2019-02-16 ENCOUNTER — Institutional Professional Consult (permissible substitution): Payer: 59 | Admitting: Pulmonary Disease

## 2019-03-02 ENCOUNTER — Encounter: Payer: Self-pay | Admitting: Internal Medicine

## 2019-03-02 ENCOUNTER — Other Ambulatory Visit: Payer: Self-pay

## 2019-03-02 ENCOUNTER — Ambulatory Visit (INDEPENDENT_AMBULATORY_CARE_PROVIDER_SITE_OTHER): Payer: 59 | Admitting: Internal Medicine

## 2019-03-02 VITALS — BP 118/78 | HR 94 | Ht 70.0 in | Wt 259.4 lb

## 2019-03-02 DIAGNOSIS — G4733 Obstructive sleep apnea (adult) (pediatric): Secondary | ICD-10-CM | POA: Diagnosis not present

## 2019-03-02 DIAGNOSIS — R0683 Snoring: Secondary | ICD-10-CM

## 2019-03-02 DIAGNOSIS — I10 Essential (primary) hypertension: Secondary | ICD-10-CM | POA: Diagnosis not present

## 2019-03-02 NOTE — Patient Instructions (Signed)
Order- please schedule unattended home sleep study   Dx OSA  Please call us for results and recommendations about 2 weeks after the sleep test. If appropriate, we may be able to start treatment before we see you next.

## 2019-03-02 NOTE — Progress Notes (Signed)
03/02/2019- 43 yoM never smoker for sleep evaluation referred by Alma Friendly, NP (PCP) for snoring & sleep apnea, sleep study done 6-7 years ago Medical problem list includes Gout, HBP, Irritable Bowel, Depression Epworth score 19 NPSG- [facility] 10/24/11- AHI 1/ hr, desat to 88%. Sleep complaints ascribed to variable shift schedule. No about possible sleep apnea because of snoring and witnessed apneas.  Restless sleeper with more daytime sleepiness after 20 pound weight gain.  No history of ENT surgery, heart or lung problems.  Negative family history for sleep apnea. Current bedtime between midnight waking once or twice before up between 430 and 5:30 AM. Works as Scientist, research (physical sciences) at [employer].   Prior to Admission medications   Medication Sig Start Date End Date Taking? Authorizing Provider  allopurinol (ZYLOPRIM) 100 MG tablet Take 2 tablets (200 mg total) by mouth daily. For gout prevention. 12/22/18  Yes Pleas Koch, NP  amLODipine (NORVASC) 10 MG tablet Take 1 tablet (10 mg total) by mouth daily. For blood pressure. 12/18/18  Yes Pleas Koch, NP  indomethacin (INDOCIN SR) 75 MG CR capsule Take 1 capsule (75 mg total) by mouth daily. 08/01/17  Yes Pleas Koch, NP  losartan (COZAAR) 100 MG tablet Take 1 tablet (100 mg total) by mouth daily. For blood pressure. 12/18/18  Yes Pleas Koch, NP  venlafaxine XR (EFFEXOR-XR) 37.5 MG 24 hr capsule Take 1 capsule (37.5 mg total) by mouth daily with breakfast. 12/18/18  Yes Pleas Koch, NP   Past Medical History:  Diagnosis Date  . Allergy   . Depression   . Gout   . Hypertension    Past Surgical History:  Procedure Laterality Date  . COLONOSCOPY  2012   Family History  Problem Relation Age of Onset  . Colon cancer Maternal Grandfather 90  . Hypertension Mother   . Hypertension Father    Social History   Socioeconomic History  . Marital status: Legally Separated    Spouse name: Not on file  . Number of children:  Not on file  . Years of education: Not on file  . Highest education level: Not on file  Occupational History  . Not on file  Social Needs  . Financial resource strain: Not on file  . Food insecurity:    Worry: Not on file    Inability: Not on file  . Transportation needs:    Medical: Not on file    Non-medical: Not on file  Tobacco Use  . Smoking status: Never Smoker  . Smokeless tobacco: Never Used  Substance and Sexual Activity  . Alcohol use: Yes    Alcohol/week: 0.0 standard drinks    Comment: rarely  . Drug use: No  . Sexual activity: Yes    Birth control/protection: None  Lifestyle  . Physical activity:    Days per week: Not on file    Minutes per session: Not on file  . Stress: Not on file  Relationships  . Social connections:    Talks on phone: Not on file    Gets together: Not on file    Attends religious service: Not on file    Active member of club or organization: Not on file    Attends meetings of clubs or organizations: Not on file    Relationship status: Not on file  . Intimate partner violence:    Fear of current or ex partner: Not on file    Emotionally abused: Not on file    Physically  abused: Not on file    Forced sexual activity: Not on file  Other Topics Concern  . Not on file  Social History Narrative   Divorced.   2 children.   CRNA, works for [employer].    Enjoys spending time with children, relaxing.    ROS-see HPI   + = positive Constitutional:    weight loss, night sweats, fevers, chills,+ fatigue, lassitude. HEENT:    headaches, difficulty swallowing, tooth/dental problems, sore throat,       sneezing, itching, ear ache, nasal congestion, post nasal drip, snoring CV:    chest pain, orthopnea, PND, swelling in lower extremities, anasarca,                                  dizziness, palpitations Resp:   shortness of breath with exertion or at rest.                productive cough,   non-productive cough, coughing up of blood.               change in color of mucus.  wheezing.   Skin:    rash or lesions. GI:  No-   heartburn, indigestion, abdominal pain, nausea, vomiting, diarrhea,                 change in bowel habits, loss of appetite GU: dysuria, change in color of urine, no urgency or frequency.   flank pain. MS:  + joint pain, stiffness, decreased range of motion, back pain. Neuro-     nothing unusual Psych:  change in mood or affect.  depression or anxiety.   memory loss.  OBJ- Physical Exam General- Alert, Oriented, Affect-appropriate, Distress- none acute, + big/ obese man Skin- rash-none, lesions- none, excoriation- none Lymphadenopathy- none Head- atraumatic            Eyes- Gross vision intact, PERRLA, conjunctivae and secretions clear            Ears- Hearing, canals-normal            Nose- Clear, no-Septal dev, mucus, polyps, erosion, perforation             Throat- Mallampati IV , mucosa clear , drainage- none, tonsils +present, + own teeth Neck- flexible , trachea midline, no stridor , thyroid nl, carotid no bruit Chest - symmetrical excursion , unlabored           Heart/CV- RRR , no murmur , no gallop  , no rub, nl s1 s2                           - JVD- none , edema- none, stasis changes- none, varices- none           Lung- clear to P&A, wheeze- none, cough- none , dullness-none, rub- none           Chest wall-  Abd-  Br/ Gen/ Rectal- Not done, not indicated Extrem- cyanosis- none, clubbing, none, atrophy- none, strength- nl Neuro- grossly intact to observation

## 2019-03-15 NOTE — Assessment & Plan Note (Addendum)
Surprising that his study 8 years ago showed negligible apnea.  Physical exam and history are strongly suggestive now after 20 pound weight gain.  Appropriate educational discussions done of OSA, medical concerns, sleep hygiene and treatments with driving responsibility. Plan-schedule sleep study

## 2019-03-15 NOTE — Assessment & Plan Note (Signed)
We discussed potential interaction between obstructive sleep apnea and blood pressure.

## 2019-03-20 MED FILL — AMLODIPINE BESYLATE 10 MG T: 10 | 90 days supply | Qty: 90 | Fill #0

## 2019-03-20 MED FILL — ALLOPURINOL 100 MG TABS: 100 | 90 days supply | Qty: 180 | Fill #0

## 2019-03-20 MED FILL — LOSARTAN POTASSIUM 100 MG T: 100 | 90 days supply | Qty: 90 | Fill #0

## 2019-04-02 MED FILL — VENLAFAXINE HCL ER 37.5 MG: 37.5 | 90 days supply | Qty: 90 | Fill #3

## 2019-04-28 ENCOUNTER — Ambulatory Visit: Payer: 59

## 2019-04-28 ENCOUNTER — Other Ambulatory Visit: Payer: Self-pay

## 2019-04-28 DIAGNOSIS — G4733 Obstructive sleep apnea (adult) (pediatric): Secondary | ICD-10-CM

## 2019-05-11 ENCOUNTER — Telehealth: Payer: Self-pay | Admitting: *Deleted

## 2019-05-11 NOTE — Telephone Encounter (Signed)
Called pt at [phone] for a 9 am PV- no answer-- [facility] or I would try again   Called at 9 am , no answer- [facility] or I would try again   Called at 905 am, no answer [facility] or I would try again   Caled at 0911 am No answer- LM to return call by 5 pm today to RS PV or the 7-27 colon would be cancelled and he could CB and RS both appts-  [id number]- pt did not CB to RS PV- colon cancelled with today's PV- NS letter mailed to pt.   Lelan Pons PV

## 2019-05-12 DIAGNOSIS — G4733 Obstructive sleep apnea (adult) (pediatric): Secondary | ICD-10-CM | POA: Diagnosis not present

## 2019-05-25 ENCOUNTER — Encounter: Payer: 59 | Admitting: Gastroenterology

## 2019-06-04 ENCOUNTER — Ambulatory Visit: Payer: 59 | Admitting: Internal Medicine

## 2019-06-08 ENCOUNTER — Telehealth: Payer: Self-pay

## 2019-06-08 NOTE — Telephone Encounter (Signed)
Received the following staff message from CY:  His home sleep test showed mild obstructive sleep apnea, averaging about 12 apneas/ hour, with drops in oxygen saturation.  I recommend that we start CPAP for trial.  Order- new DME, new CPAP auto 5-20, mask of choice, humidifier, supplies, AirView/ card  Please mask sure he has a return ov in 31-90 days.   I am routing this message to myself to follow-up on tomorrow as it is after 5:00 PM EDT.

## 2019-06-09 NOTE — Telephone Encounter (Signed)
ATC pt, line went to voicemail, LMTCB x1.

## 2019-06-10 NOTE — Telephone Encounter (Signed)
ATC pt, line went to voicemail. Per DPR, I have left a detailed message of CY's results and recommendations and let pt know to call our office back if he would agree with starting CPAP and/or if he has any further questions. I am leaving this encounter open to attempt to call pt back later.

## 2019-06-11 DIAGNOSIS — G4733 Obstructive sleep apnea (adult) (pediatric): Secondary | ICD-10-CM

## 2019-06-11 NOTE — Telephone Encounter (Signed)
ATC pt, line went to voicemail. I have left a message for pt to call back x3. Will send a MyChart message with pt's results and recommendations and close this encounter per triage protocol.

## 2019-06-22 DIAGNOSIS — R360 Urethral discharge without blood: Secondary | ICD-10-CM | POA: Diagnosis not present

## 2019-07-02 ENCOUNTER — Other Ambulatory Visit: Payer: Self-pay | Admitting: Primary Care

## 2019-07-02 DIAGNOSIS — F3342 Major depressive disorder, recurrent, in full remission: Secondary | ICD-10-CM

## 2019-07-02 MED FILL — VENLAFAXINE HCL ER 37.5 MG: 37.5 | 90 days supply | Qty: 90 | Fill #0

## 2019-07-09 DIAGNOSIS — G4733 Obstructive sleep apnea (adult) (pediatric): Secondary | ICD-10-CM | POA: Diagnosis not present

## 2019-07-10 MED FILL — AMLODIPINE BESYLATE 10 MG T: 10 | 90 days supply | Qty: 90 | Fill #0

## 2019-07-10 MED FILL — LOSARTAN POTASSIUM 100 MG T: 100 | 90 days supply | Qty: 90 | Fill #0

## 2019-07-10 MED FILL — ALLOPURINOL 100 MG TABS: 100 | 90 days supply | Qty: 180 | Fill #0

## 2019-08-08 DIAGNOSIS — G4733 Obstructive sleep apnea (adult) (pediatric): Secondary | ICD-10-CM | POA: Diagnosis not present

## 2019-09-03 ENCOUNTER — Encounter: Payer: Self-pay | Admitting: Primary Care

## 2019-09-03 ENCOUNTER — Ambulatory Visit (INDEPENDENT_AMBULATORY_CARE_PROVIDER_SITE_OTHER): Payer: 59 | Admitting: Primary Care

## 2019-09-03 ENCOUNTER — Other Ambulatory Visit: Payer: Self-pay

## 2019-09-03 VITALS — BP 130/70 | HR 71 | Temp 96.9°F | Ht 70.0 in | Wt 261.5 lb

## 2019-09-03 DIAGNOSIS — M1A9XX0 Chronic gout, unspecified, without tophus (tophi): Secondary | ICD-10-CM | POA: Diagnosis not present

## 2019-09-03 DIAGNOSIS — I10 Essential (primary) hypertension: Secondary | ICD-10-CM | POA: Diagnosis not present

## 2019-09-03 MED ORDER — HYDROCHLOROTHIAZIDE 25 MG PO TABS: 25.0000 mg | ORAL_TABLET | Freq: Every day | ORAL | 0 refills | Status: DC

## 2019-09-03 MED FILL — HYDROCHLOROTHIAZIDE 25 MG T: 25 | 30 days supply | Qty: 30 | Fill #0

## 2019-09-03 NOTE — Assessment & Plan Note (Signed)
No recent flares, compliant to allopurinol daily. Repeat Uric acid level next visit.  Discussed that HCTZ may provoke gout.

## 2019-09-03 NOTE — Progress Notes (Signed)
Subjective:    Patient ID: Joseph Ferguson, male    DOB: 02-Dec-1975, 43 y.o.   MRN: [id number]  HPI  Joseph Ferguson is a 43 year old male with a history of hypertension, depression, chronic gout who presents today with a chief complaint of hypertension.  He is currently managed on amlodipine 10 mg and losartan 100 mg daily for blood pressure. [name] been checking his BP which is running 130'-150'/80'-100's. He has noticed bilateral ankle swelling for which he notices while at work. 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   He was once managed on HCTZ in the past, felt better managed on HCTZ but this was stopped due to gout flares. He would like to switch back to HCTZ from losartan. He is now on allopurinol for gout prevention, last gout flare was over one year ago. Uric acid level of 6.0 in April 2020.  BP Readings from Last 3 Encounters:  09/03/19 130/70  03/02/19 118/78  12/18/18 120/86   Wt Readings from Last 3 Encounters:  09/03/19 261 lb 8 oz (118.6 kg)  03/02/19 259 lb 6.4 oz (117.7 kg)  12/18/18 258 lb 12 oz (117.4 kg)     Review of Systems  Eyes: Negative for visual disturbance.  Respiratory: Negative for shortness of breath.   Cardiovascular: Positive for leg swelling. Negative for chest pain.  Neurological: Negative for dizziness and headaches.       Past Medical History:  Diagnosis Date  . Allergy   . Depression   . Gout   . Hypertension      Social History   Socioeconomic History  . Marital status: Legally Separated    Spouse name: Not on file  . Number of children: Not on file  . Years of education: Not on file  . Highest education level: Not on file  Occupational History  . Not on file  Social Needs  . Financial resource strain: Not on file  . Food insecurity    Worry: Not on file    Inability: Not on file  . Transportation needs    Medical: Not on file    Non-medical: Not on file  Tobacco Use  . Smoking status:  Never Smoker  . Smokeless tobacco: Never Used  Substance and Sexual Activity  . Alcohol use: Yes    Alcohol/week: 0.0 standard drinks    Comment: rarely  . Drug use: No  . Sexual activity: Yes    Birth control/protection: None  Lifestyle  . Physical activity    Days per week: Not on file    Minutes per session: Not on file  . Stress: Not on file  Relationships  . Social Herbalist on phone: Not on file    Gets together: Not on file    Attends religious service: Not on file    Active member of club or organization: Not on file    Attends meetings of clubs or organizations: Not on file    Relationship status: Not on file  . Intimate partner violence    Fear of current or ex partner: Not on file    Emotionally abused: Not on file    Physically abused: Not on file    Forced sexual activity: Not on file  Other Topics Concern  . Not on file  Social History Narrative   Divorced.   2 children.   CRNA, works for [employer].    Enjoys spending time with children, relaxing.  Past Surgical History:  Procedure Laterality Date  . COLONOSCOPY  2012    Family History  Problem Relation Age of Onset  . Colon cancer Maternal Grandfather 90  . Hypertension Mother   . Hypertension Father     Allergies  Allergen Reactions  . Shellfish Allergy Swelling    Current Outpatient Medications on File Prior to Visit  Medication Sig Dispense Refill  . allopurinol (ZYLOPRIM) 100 MG tablet Take 2 tablets (200 mg total) by mouth daily. For gout prevention. 180 tablet 3  . amLODipine (NORVASC) 10 MG tablet Take 1 tablet (10 mg total) by mouth daily. For blood pressure. 90 tablet 3  . venlafaxine XR (EFFEXOR-XR) 37.5 MG 24 hr capsule TAKE 1 CAPSULE BY MOUTH DAILY WITH BREAKFAST 90 capsule 1  . indomethacin (INDOCIN SR) 75 MG CR capsule Take 1 capsule (75 mg total) by mouth daily. (Patient not taking: Reported on 09/03/2019) 90 capsule 3   No current facility-administered medications on  file prior to visit.     BP 130/70   Pulse 71   Temp (!) 96.9 F (36.1 C) (Temporal)   Ht 5\' 10"  (1.778 m)   Wt 261 lb 8 oz (118.6 kg)   SpO2 97%   BMI 37.52 kg/m     Objective:   Physical Exam  Constitutional: He appears well-nourished.  Neck: Neck supple.  Cardiovascular: Normal rate and regular rhythm.  Mild right lateral ankle edema  Respiratory: Effort normal and breath sounds normal.  Skin: Skin is warm and dry.           Assessment & Plan:

## 2019-09-03 NOTE — Assessment & Plan Note (Signed)
Stable but borderline high today, elevated readings at home and work.   Discussed that switching back to HCTZ may provoke gout flares, he verbalized understanding and would like to proceed. He is on allopurinol. Uric acid level of 6.0 in April 2020.  Rx for HCTZ sent to pharmacy. Stop Losartan. Continue Amlodipine.   Follow up in 2-3 weeks for BP check and labs.

## 2019-09-03 NOTE — Patient Instructions (Signed)
Stop losartan 100 mg for blood pressure. Start hydrochlorothiazide 25 mg once daily for blood pressure. Continue taking amlodipine 10 mg for blood pressure.  Continue to monitor your blood pressure.   Schedule a follow up visit for 2-3 weeks for blood pressure check and labs.  It was a pleasure to see you today!

## 2019-09-08 DIAGNOSIS — G4733 Obstructive sleep apnea (adult) (pediatric): Secondary | ICD-10-CM | POA: Diagnosis not present

## 2019-10-08 DIAGNOSIS — G4733 Obstructive sleep apnea (adult) (pediatric): Secondary | ICD-10-CM | POA: Diagnosis not present

## 2019-10-12 ENCOUNTER — Encounter: Payer: Self-pay | Admitting: Gastroenterology

## 2019-10-12 ENCOUNTER — Encounter: Payer: Self-pay | Admitting: Primary Care

## 2019-10-12 ENCOUNTER — Ambulatory Visit (INDEPENDENT_AMBULATORY_CARE_PROVIDER_SITE_OTHER): Payer: 59 | Admitting: Primary Care

## 2019-10-12 ENCOUNTER — Other Ambulatory Visit: Payer: Self-pay

## 2019-10-12 VITALS — BP 144/76 | HR 90 | Temp 97.6°F | Ht 70.0 in | Wt 260.0 lb

## 2019-10-12 DIAGNOSIS — Z113 Encounter for screening for infections with a predominantly sexual mode of transmission: Secondary | ICD-10-CM | POA: Diagnosis not present

## 2019-10-12 DIAGNOSIS — D126 Benign neoplasm of colon, unspecified: Secondary | ICD-10-CM | POA: Diagnosis not present

## 2019-10-12 DIAGNOSIS — R7303 Prediabetes: Secondary | ICD-10-CM

## 2019-10-12 DIAGNOSIS — I10 Essential (primary) hypertension: Secondary | ICD-10-CM

## 2019-10-12 DIAGNOSIS — M1A9XX0 Chronic gout, unspecified, without tophus (tophi): Secondary | ICD-10-CM

## 2019-10-12 LAB — BASIC METABOLIC PANEL
BUN: 10 mg/dL (ref 6–23)
CO2: 28 mEq/L (ref 19–32)
Calcium: 9.2 mg/dL (ref 8.4–10.5)
Chloride: 105 mEq/L (ref 96–112)
Creatinine, Ser: 1.18 mg/dL (ref 0.40–1.50)
GFR: 81.2 mL/min (ref >60.00)
Glucose, Bld: 109 mg/dL — ABNORMAL HIGH (ref 70–99)
Potassium: 4.2 mEq/L (ref 3.5–5.1)
Sodium: 140 mEq/L (ref 135–145)

## 2019-10-12 LAB — TSH: TSH: 0.63 u[IU]/mL (ref 0.35–4.50)

## 2019-10-12 LAB — HEMOGLOBIN A1C: Hgb A1c MFr Bld: 5.7 % (ref 4.6–6.5)

## 2019-10-12 LAB — URIC ACID: Uric Acid, Serum: 5.3 mg/dL (ref 4.0–7.8)

## 2019-10-12 MED ORDER — LOSARTAN POTASSIUM 50 MG PO TABS: 50.0000 mg | ORAL_TABLET | Freq: Every day | ORAL | 0 refills | Status: DC

## 2019-10-12 MED ORDER — HYDROCHLOROTHIAZIDE 25 MG PO TABS: 25.0000 mg | ORAL_TABLET | Freq: Every day | ORAL | 3 refills | Status: DC

## 2019-10-12 MED FILL — HYDROCHLOROTHIAZIDE 25 MG T: 25 | 90 days supply | Qty: 90 | Fill #0

## 2019-10-12 MED FILL — LOSARTAN POTASSIUM 50 MG TA: 50 | 90 days supply | Qty: 90 | Fill #0

## 2019-10-12 NOTE — Assessment & Plan Note (Signed)
Above goal and even higher compared to last visit. Home readings are about the same.  Suspect losartan to be helping more than suspected, will add in 50 mg daily. Continue Amlodipine 10 mg and HCTZ 25 mg.   BMP pending.

## 2019-10-12 NOTE — Assessment & Plan Note (Signed)
Denies gout flares, repeat Uric acid pending.

## 2019-10-12 NOTE — Assessment & Plan Note (Signed)
Patient will be rescheduling colonoscopy soon.

## 2019-10-12 NOTE — Assessment & Plan Note (Signed)
A1C of 5.8 on prior labs, repeat A1C pending.

## 2019-10-12 NOTE — Progress Notes (Signed)
Subjective:    Patient ID: Joseph Ferguson, male    DOB: [birth year], 43 y.o.   MRN: [id number]  HPI   This visit occurred during the SARS-CoV-2 public health emergency.  Safety protocols were in place, including screening questions prior to the visit, additional usage of staff PPE, and extensive cleaning of exam room while observing appropriate contact time as indicated for disinfecting solutions.     Joseph Ferguson is a 43 year old male who presents today for follow up of hypertension and repeat labs. He would also like STD screening, he has no symptoms.   He was last evaluated on 09/03/19 for reports of hypertension that he noticed when checking at home, readings in the 130-150's/100's. Also with bilateral ankle swelling. Previously managed on HCTZ and felt better, wanted to switch back despite history of gout, we discontinued losartan and initiated HCTZ. Continued Amlodipine.  Since his last visit he's checking his BP at home with readings of 140's/90's. He denies dizziness, headaches, visual disturbances, gout flares. His lower extremity swelling has improved. He is compliant to all medications.   BP Readings from Last 3 Encounters:  10/12/19 (!) 144/76  09/03/19 130/70  03/02/19 118/78   Wt Readings from Last 3 Encounters:  10/12/19 260 lb (117.9 kg)  09/03/19 261 lb 8 oz (118.6 kg)  03/02/19 259 lb 6.4 oz (117.7 kg)   He is exercising three times weekly, tries to watch his diet, has struggled to lose weight despite this.  Review of Systems  Eyes: Negative for visual disturbance.  Respiratory: Negative for shortness of breath.   Cardiovascular: Negative for chest pain.  Neurological: Negative for dizziness and headaches.       Past Medical History:  Diagnosis Date  . Allergy   . Depression   . Gout   . Hypertension      Social History   Socioeconomic History  . Marital status: Divorced    Spouse name: Not on file  . Number of children: Not on file  . Years of  education: Not on file  . Highest education level: Not on file  Occupational History  . Not on file  Tobacco Use  . Smoking status: Never Smoker  . Smokeless tobacco: Never Used  Substance and Sexual Activity  . Alcohol use: Yes    Alcohol/week: 0.0 standard drinks    Comment: rarely  . Drug use: No  . Sexual activity: Yes    Birth control/protection: None  Other Topics Concern  . Not on file  Social History Narrative   Divorced.   2 children.   CRNA, works for [employer].    Enjoys spending time with children, relaxing.    Social Determinants of Health   Financial Resource Strain:   . Difficulty of Paying Living Expenses: Not on file  Food Insecurity:   . Worried About Charity fundraiser in the Last Year: Not on file  . Ran Out of Food in the Last Year: Not on file  Transportation Needs:   . Lack of Transportation (Medical): Not on file  . Lack of Transportation (Non-Medical): Not on file  Physical Activity:   . Days of Exercise per Week: Not on file  . Minutes of Exercise per Session: Not on file  Stress:   . Feeling of Stress : Not on file  Social Connections:   . Frequency of Communication with Friends and Family: Not on file  . Frequency of Social Gatherings with Friends and Family: Not on  file  . Attends Religious Services: Not on file  . Active Member of Clubs or Organizations: Not on file  . Attends Archivist Meetings: Not on file  . Marital Status: Not on file  Intimate Partner Violence:   . Fear of Current or Ex-Partner: Not on file  . Emotionally Abused: Not on file  . Physically Abused: Not on file  . Sexually Abused: Not on file    Past Surgical History:  Procedure Laterality Date  . COLONOSCOPY  2012    Family History  Problem Relation Age of Onset  . Colon cancer Maternal Grandfather 90  . Hypertension Father     Allergies  Allergen Reactions  . Shellfish Allergy Swelling    Current Outpatient Medications on File Prior to Visit    Medication Sig Dispense Refill  . allopurinol (ZYLOPRIM) 100 MG tablet Take 2 tablets (200 mg total) by mouth daily. For gout prevention. 180 tablet 3  . amLODipine (NORVASC) 10 MG tablet Take 1 tablet (10 mg total) by mouth daily. For blood pressure. 90 tablet 3  . venlafaxine XR (EFFEXOR-XR) 37.5 MG 24 hr capsule TAKE 1 CAPSULE BY MOUTH DAILY WITH BREAKFAST 90 capsule 1  . indomethacin (INDOCIN SR) 75 MG CR capsule Take 1 capsule (75 mg total) by mouth daily. (Patient not taking: Reported on 09/03/2019) 90 capsule 3   No current facility-administered medications on file prior to visit.    BP (!) 144/76   Pulse 90   Temp 97.6 F (36.4 C) (Temporal)   Ht 5\' 10"  (1.778 m)   Wt 260 lb (117.9 kg)   SpO2 98%   BMI 37.31 kg/m    Objective:   Physical Exam  Constitutional: He appears well-nourished.  Cardiovascular: Normal rate and regular rhythm.  Respiratory: Effort normal and breath sounds normal.  Musculoskeletal:     Cervical back: Neck supple.  Skin: Skin is warm and dry.  Psychiatric: He has a normal mood and affect.           Assessment & Plan:

## 2019-10-12 NOTE — Patient Instructions (Signed)
Stop by the lab prior to leaving today. I will notify you of your results once received.   Start losartan 50 mg once daily for blood pressure. Continue Amlodipine 10 mg and HCTZ 25 mg.  Schedule a follow up visit for 2-3 weeks for blood pressure check.  It was a pleasure to see you today!

## 2019-10-13 LAB — RPR: RPR Ser Ql: NONREACTIVE

## 2019-10-13 LAB — HEPATITIS C ANTIBODY
Hepatitis C Ab: NONREACTIVE
SIGNAL TO CUT-OFF: 0.11 (ref <1.00)

## 2019-10-13 LAB — C. TRACHOMATIS/N. GONORRHOEAE RNA
C. trachomatis RNA, TMA: NOT DETECTED
N. gonorrhoeae RNA, TMA: NOT DETECTED

## 2019-10-13 LAB — HIV ANTIBODY (ROUTINE TESTING W REFLEX): HIV 1&2 Ab, 4th Generation: NONREACTIVE

## 2019-11-09 ENCOUNTER — Ambulatory Visit (INDEPENDENT_AMBULATORY_CARE_PROVIDER_SITE_OTHER): Payer: 59 | Admitting: Primary Care

## 2019-11-09 ENCOUNTER — Encounter: Payer: Self-pay | Admitting: Primary Care

## 2019-11-09 ENCOUNTER — Other Ambulatory Visit: Payer: Self-pay

## 2019-11-09 DIAGNOSIS — M1A9XX0 Chronic gout, unspecified, without tophus (tophi): Secondary | ICD-10-CM

## 2019-11-09 DIAGNOSIS — I10 Essential (primary) hypertension: Secondary | ICD-10-CM | POA: Diagnosis not present

## 2019-11-09 DIAGNOSIS — H52223 Regular astigmatism, bilateral: Secondary | ICD-10-CM | POA: Diagnosis not present

## 2019-11-09 LAB — BASIC METABOLIC PANEL
BUN: 14 mg/dL (ref 6–23)
CO2: 31 mEq/L (ref 19–32)
Calcium: 9.5 mg/dL (ref 8.4–10.5)
Chloride: 101 mEq/L (ref 96–112)
Creatinine, Ser: 1.2 mg/dL (ref 0.40–1.50)
GFR: 79.61 mL/min (ref >60.00)
Glucose, Bld: 104 mg/dL — ABNORMAL HIGH (ref 70–99)
Potassium: 3.6 mEq/L (ref 3.5–5.1)
Sodium: 138 mEq/L (ref 135–145)

## 2019-11-09 MED ORDER — AMLODIPINE BESYLATE 10 MG PO TABS: 10.0000 mg | ORAL_TABLET | Freq: Every day | ORAL | 3 refills | Status: DC

## 2019-11-09 MED ORDER — ALLOPURINOL 100 MG PO TABS: 200.0000 mg | ORAL_TABLET | Freq: Every day | ORAL | 3 refills | Status: DC

## 2019-11-09 MED FILL — ALLOPURINOL 100 MG TABS: 100 | 90 days supply | Qty: 180 | Fill #0

## 2019-11-09 MED FILL — AMLODIPINE BESYLATE 10 MG T: 10 | 90 days supply | Qty: 90 | Fill #0

## 2019-11-09 NOTE — Assessment & Plan Note (Signed)
Improved and at goal on Amlodipine 10 mg, HCTZ 25 mg and losartan 50 mg. Repeat BMP pending. Continue same.

## 2019-11-09 NOTE — Patient Instructions (Signed)
Stop by the lab prior to leaving today. I will notify you of your results once received.   It was a pleasure to see you today!  

## 2019-11-09 NOTE — Progress Notes (Signed)
Subjective:    Patient ID: Joseph Ferguson, male    DOB: [birth year], 44 y.o.   MRN: [id number]  HPI  This visit occurred during the SARS-CoV-2 public health emergency.  Safety protocols were in place, including screening questions prior to the visit, additional usage of staff PPE, and extensive cleaning of exam room while observing appropriate contact time as indicated for disinfecting solutions.   Joseph Ferguson is a 44 year old male with a history of hypertension, migraines, chronic gout, prediabetes who presents today for follow up of hypertension. He is also needing medication refills.   He was last evaluated 10/12/19 for follow up of hypertension. During that visit BP was elevated, including home readings, despite management of Amlodipine 10 mg and HCTZ 25 mg. Given this we added losartan 50 mg. He is here today for follow up.  Since his last visit he's feeling better. [name] checking home readings which are running 120's/130's/80's. He denies chest pain, dizziness, visual changes.   BP Readings from Last 3 Encounters:  11/09/19 116/76  10/12/19 (!) 144/76  09/03/19 130/70     Review of Systems  Eyes: Negative for visual disturbance.  Respiratory: Negative for shortness of breath.   Cardiovascular: Negative for chest pain.  Neurological: Negative for dizziness and headaches.       Past Medical History:  Diagnosis Date  . Allergy   . Depression   . Gout   . Hypertension      Social History   Socioeconomic History  . Marital status: Divorced    Spouse name: Not on file  . Number of children: Not on file  . Years of education: Not on file  . Highest education level: Not on file  Occupational History  . Not on file  Tobacco Use  . Smoking status: Never Smoker  . Smokeless tobacco: Never Used  Substance and Sexual Activity  . Alcohol use: Yes    Alcohol/week: 0.0 standard drinks    Comment: rarely  . Drug use: No  . Sexual activity: Yes    Birth  control/protection: None  Other Topics Concern  . Not on file  Social History Narrative   Divorced.   2 children.   CRNA, works for [employer].    Enjoys spending time with children, relaxing.    Social Determinants of Health   Financial Resource Strain:   . Difficulty of Paying Living Expenses: Not on file  Food Insecurity:   . Worried About Charity fundraiser in the Last Year: Not on file  . Ran Out of Food in the Last Year: Not on file  Transportation Needs:   . Lack of Transportation (Medical): Not on file  . Lack of Transportation (Non-Medical): Not on file  Physical Activity:   . Days of Exercise per Week: Not on file  . Minutes of Exercise per Session: Not on file  Stress:   . Feeling of Stress : Not on file  Social Connections:   . Frequency of Communication with Friends and Family: Not on file  . Frequency of Social Gatherings with Friends and Family: Not on file  . Attends Religious Services: Not on file  . Active Member of Clubs or Organizations: Not on file  . Attends Archivist Meetings: Not on file  . Marital Status: Not on file  Intimate Partner Violence:   . Fear of Current or Ex-Partner: Not on file  . Emotionally Abused: Not on file  . Physically Abused: Not on file  .  Sexually Abused: Not on file    Past Surgical History:  Procedure Laterality Date  . COLONOSCOPY  2012    Family History  Problem Relation Age of Onset  . Colon cancer Maternal Grandfather 90  . Hypertension Father     Allergies  Allergen Reactions  . Shellfish Allergy Swelling    Current Outpatient Medications on File Prior to Visit  Medication Sig Dispense Refill  . hydrochlorothiazide (HYDRODIURIL) 25 MG tablet Take 1 tablet (25 mg total) by mouth daily. For blood pressure. 90 tablet 3  . losartan (COZAAR) 50 MG tablet Take 1 tablet (50 mg total) by mouth daily. For blood pressure. 90 tablet 0  . venlafaxine XR (EFFEXOR-XR) 37.5 MG 24 hr capsule TAKE 1 CAPSULE BY MOUTH  DAILY WITH BREAKFAST 90 capsule 1  . indomethacin (INDOCIN SR) 75 MG CR capsule Take 1 capsule (75 mg total) by mouth daily. (Patient not taking: Reported on 09/03/2019) 90 capsule 3   No current facility-administered medications on file prior to visit.    BP 116/76   Pulse 70   Temp (!) 96.2 F (35.7 C) (Temporal)   Ht 5\' 10"  (1.778 m)   Wt 258 lb 8 oz (117.3 kg)   SpO2 98%   BMI 37.09 kg/m    Objective:   Physical Exam  Constitutional: He appears well-nourished.  Cardiovascular: Normal rate and regular rhythm.  Respiratory: Effort normal and breath sounds normal.  Musculoskeletal:     Cervical back: Neck supple.  Skin: Skin is warm and dry.  Psychiatric: He has a normal mood and affect.           Assessment & Plan:

## 2019-11-10 ENCOUNTER — Other Ambulatory Visit: Payer: Self-pay

## 2019-11-10 ENCOUNTER — Ambulatory Visit (AMBULATORY_SURGERY_CENTER): Payer: Self-pay | Admitting: *Deleted

## 2019-11-10 VITALS — Temp 96.9°F | Ht 70.0 in | Wt 252.0 lb

## 2019-11-10 DIAGNOSIS — Z86010 Personal history of colonic polyps: Secondary | ICD-10-CM

## 2019-11-10 DIAGNOSIS — Z01818 Encounter for other preprocedural examination: Secondary | ICD-10-CM

## 2019-11-10 MED ORDER — SUPREP BOWEL PREP KIT 17.5-3.13-1.6 GM/177ML PO SOLN: 1.0000 | Freq: Once | ORAL | 0 refills | Status: AC

## 2019-11-10 MED FILL — SUPREP BOWEL PREP KIT: 17.5-3.13-1 | 1 days supply | Qty: 354 | Fill #0

## 2019-11-10 NOTE — Progress Notes (Signed)

## 2019-11-18 ENCOUNTER — Telehealth: Payer: Self-pay | Admitting: Gastroenterology

## 2019-11-18 NOTE — Telephone Encounter (Signed)
Spoke with pt and he will pick up sample from the third floor in the next few days

## 2019-11-24 ENCOUNTER — Encounter: Payer: Self-pay | Admitting: Gastroenterology

## 2019-11-26 ENCOUNTER — Other Ambulatory Visit: Payer: Self-pay | Admitting: Gastroenterology

## 2019-11-26 ENCOUNTER — Ambulatory Visit (INDEPENDENT_AMBULATORY_CARE_PROVIDER_SITE_OTHER): Payer: Self-pay

## 2019-11-26 DIAGNOSIS — Z1159 Encounter for screening for other viral diseases: Secondary | ICD-10-CM | POA: Diagnosis not present

## 2019-11-27 ENCOUNTER — Encounter: Payer: 59 | Admitting: Gastroenterology

## 2019-11-27 LAB — SARS CORONAVIRUS 2 (TAT 6-24 HRS): SARS Coronavirus 2: NEGATIVE

## 2019-11-30 ENCOUNTER — Other Ambulatory Visit: Payer: Self-pay

## 2019-11-30 ENCOUNTER — Encounter: Payer: Self-pay | Admitting: Gastroenterology

## 2019-11-30 ENCOUNTER — Ambulatory Visit (AMBULATORY_SURGERY_CENTER): Payer: 59 | Admitting: Gastroenterology

## 2019-11-30 VITALS — BP 123/93 | HR 58 | Temp 96.8°F | Resp 10 | Ht 70.0 in | Wt 258.0 lb

## 2019-11-30 DIAGNOSIS — Z86010 Personal history of colonic polyps: Secondary | ICD-10-CM

## 2019-11-30 DIAGNOSIS — Z1211 Encounter for screening for malignant neoplasm of colon: Secondary | ICD-10-CM | POA: Diagnosis not present

## 2019-11-30 DIAGNOSIS — D123 Benign neoplasm of transverse colon: Secondary | ICD-10-CM | POA: Diagnosis not present

## 2019-11-30 DIAGNOSIS — I10 Essential (primary) hypertension: Secondary | ICD-10-CM | POA: Diagnosis not present

## 2019-11-30 DIAGNOSIS — K635 Polyp of colon: Secondary | ICD-10-CM | POA: Diagnosis not present

## 2019-11-30 DIAGNOSIS — G4733 Obstructive sleep apnea (adult) (pediatric): Secondary | ICD-10-CM | POA: Diagnosis not present

## 2019-11-30 MED ORDER — SODIUM CHLORIDE 0.9 % IV SOLN: 500.0000 mL | Freq: Once | INTRAVENOUS | Status: DC

## 2019-11-30 NOTE — Progress Notes (Signed)
Pt's states no medical or surgical changes since previsit or office visit.  Temp- June Vitals- Donna 

## 2019-11-30 NOTE — Op Note (Signed)
[facility] Patient Name: Joseph Ferguson Procedure Date: 11/30/2019 1:26 PM MRN: [id number] Endoscopist: Milus Banister , MD Age: 44 Referring MD:  Date of Birth: 13-Feb-1976 Gender: Male Account #: [id number] Procedure:                Colonoscopy Indications:              High risk colon cancer surveillance: Personal                            history of colonic polyps; Colonoscopy 2008 single                            39mm TVA. Colonoscopy 2012 no polyps Medicines:                Monitored Anesthesia Care Procedure:                Pre-Anesthesia Assessment:                           - Prior to the procedure, a History and Physical                            was performed, and patient medications and                            allergies were reviewed. The patient's tolerance of                            previous anesthesia was also reviewed. The risks                            and benefits of the procedure and the sedation                            options and risks were discussed with the patient.                            All questions were answered, and informed consent                            was obtained. Prior Anticoagulants: The patient has                            taken no previous anticoagulant or antiplatelet                            agents. ASA Grade Assessment: II - A patient with                            mild systemic disease. After reviewing the risks                            and benefits, the patient was deemed in  satisfactory condition to undergo the procedure.                           After obtaining informed consent, the colonoscope                            was passed under direct vision. Throughout the                            procedure, the patient's blood pressure, pulse, and                            oxygen saturations were monitored continuously. The                            Colonoscope was introduced  through the anus and                            advanced to the the cecum, identified by                            appendiceal orifice and ileocecal valve. The                            colonoscopy was performed without difficulty. The                            patient tolerated the procedure well. The quality                            of the bowel preparation was good. The ileocecal                            valve, appendiceal orifice, and rectum were                            photographed. Scope In: 1:31:05 PM Scope Out: 1:40:55 PM Scope Withdrawal Time: 0 hours 8 minutes 14 seconds  Total Procedure Duration: 0 hours 9 minutes 50 seconds  Findings:                 A 2 mm polyp was found in the ascending colon. The                            polyp was sessile. The polyp was removed with a                            cold snare. Resection and retrieval were complete.                           Multiple small and large-mouthed diverticula were                            found in the left colon.  The exam was otherwise without abnormality on                            direct and retroflexion views. Complications:            No immediate complications. Estimated blood loss:                            None. Estimated Blood Loss:     Estimated blood loss: none. Impression:               - One 2 mm polyp in the ascending colon, removed                            with a cold snare. Resected and retrieved.                           - Diverticulosis in the left colon.                           - The examination was otherwise normal on direct                            and retroflexion views. Recommendation:           - Patient has a contact number available for                            emergencies. The signs and symptoms of potential                            delayed complications were discussed with the                            patient. Return to normal  activities tomorrow.                            Written discharge instructions were provided to the                            patient.                           - Resume previous diet.                           - Continue present medications.                           - Await pathology results. Milus Banister, MD 11/30/2019 1:43:17 PM This report has been signed electronically.

## 2019-11-30 NOTE — Patient Instructions (Signed)
HANDOUTS PROVIDED ON: POLYPS & DIVERTICULOSIS  The polyps removed today have been sent for pathology.  The results can take 1-3 weeks to receive.  When your next colonoscopy should occur will be based on the pathology results.    You may resume your previous diet and medication schedule.  Thank you for allowing [place] to care for you today!!!  YOU HAD AN ENDOSCOPIC PROCEDURE TODAY AT [facility]:   Refer to the procedure report that was given to you for any specific questions about what was found during the examination.  If the procedure report does not answer your questions, please call your gastroenterologist to clarify.  If you requested that your care partner not be given the details of your procedure findings, then the procedure report has been included in a sealed envelope for you to review at your convenience later.  YOU SHOULD EXPECT: Some feelings of bloating in the abdomen. Passage of more gas than usual.  Walking can help get rid of the air that was put into your GI tract during the procedure and reduce the bloating. If you had a lower endoscopy (such as a colonoscopy or flexible sigmoidoscopy) you may notice spotting of blood in your stool or on the toilet paper. If you underwent a bowel prep for your procedure, you may not have a normal bowel movement for a few days.  Please Note:  You might notice some irritation and congestion in your nose or some drainage.  This is from the oxygen used during your procedure.  There is no need for concern and it should clear up in a day or so.  SYMPTOMS TO REPORT IMMEDIATELY:   Following lower endoscopy (colonoscopy or flexible sigmoidoscopy):  Excessive amounts of blood in the stool  Significant tenderness or worsening of abdominal pains  Swelling of the abdomen that is new, acute  Fever of 100F or higher  For urgent or emergent issues, a gastroenterologist can be reached at any hour by calling [phone].   DIET:  We do  recommend a small meal at first, but then you may proceed to your regular diet.  Drink plenty of fluids but you should avoid alcoholic beverages for 24 hours.  ACTIVITY:  You should plan to take it easy for the rest of today and you should NOT DRIVE or use heavy machinery until tomorrow (because of the sedation medicines used during the test).    FOLLOW UP: Our staff will call the number listed on your records 48-72 hours following your procedure to check on you and address any questions or concerns that you may have regarding the information given to you following your procedure. If we do not reach you, we will leave a message.  We will attempt to reach you two times.  During this call, we will ask if you have developed any symptoms of COVID 19. If you develop any symptoms (ie: fever, flu-like symptoms, shortness of breath, cough etc.) before then, please call [phone].  If you test positive for Covid 19 in the 2 weeks post procedure, please call and report this information to [place].    If any biopsies were taken you will be contacted by phone or by letter within the next 1-3 weeks.  Please call us at [phone] if you have not heard about the biopsies in 3 weeks.    SIGNATURES/CONFIDENTIALITY: You and/or your care partner have signed paperwork which will be entered into your electronic medical record.  These signatures attest  to the fact that that the information above on your After Visit Summary has been reviewed and is understood.  Full responsibility of the confidentiality of this discharge information lies with you and/or your care-partner.

## 2019-11-30 NOTE — Progress Notes (Signed)
Report given to PACU, vss 

## 2019-11-30 NOTE — Progress Notes (Signed)
Called to room to assist during endoscopic procedure.  Patient ID and intended procedure confirmed with present staff. Received instructions for my participation in the procedure from the performing physician.  

## 2019-12-02 ENCOUNTER — Telehealth: Payer: Self-pay | Admitting: *Deleted

## 2019-12-02 ENCOUNTER — Telehealth: Payer: Self-pay

## 2019-12-02 NOTE — Telephone Encounter (Signed)
No answer for post procedure call back. Left message for patient to call back with questions or concerns. 

## 2019-12-02 NOTE — Telephone Encounter (Signed)
Attempted to reach patient for post-procedure f/u call. No answer. Left message that we will make another attempt to reach him again later today and for him to please not hesitate to call us if he has any questions/concerns regarding his care. 

## 2019-12-09 DIAGNOSIS — Z630 Problems in relationship with spouse or partner: Secondary | ICD-10-CM | POA: Diagnosis not present

## 2019-12-18 ENCOUNTER — Other Ambulatory Visit: Payer: Self-pay | Admitting: Primary Care

## 2019-12-18 DIAGNOSIS — F3342 Major depressive disorder, recurrent, in full remission: Secondary | ICD-10-CM

## 2019-12-18 MED FILL — VENLAFAXINE HCL ER 37.5 MG: 37.5 | 90 days supply | Qty: 90 | Fill #0

## 2019-12-20 DIAGNOSIS — Z630 Problems in relationship with spouse or partner: Secondary | ICD-10-CM | POA: Diagnosis not present

## 2019-12-29 DIAGNOSIS — Z630 Problems in relationship with spouse or partner: Secondary | ICD-10-CM | POA: Diagnosis not present

## 2020-01-03 DIAGNOSIS — Z630 Problems in relationship with spouse or partner: Secondary | ICD-10-CM | POA: Diagnosis not present

## 2020-01-05 DIAGNOSIS — Z630 Problems in relationship with spouse or partner: Secondary | ICD-10-CM | POA: Diagnosis not present

## 2020-01-19 ENCOUNTER — Other Ambulatory Visit: Payer: Self-pay | Admitting: Primary Care

## 2020-01-19 DIAGNOSIS — I10 Essential (primary) hypertension: Secondary | ICD-10-CM

## 2020-01-19 MED FILL — LOSARTAN POTASSIUM 50 MG TA: 50 | 90 days supply | Qty: 90 | Fill #0

## 2020-01-19 MED FILL — HYDROCHLOROTHIAZIDE 25 MG T: 25 | 90 days supply | Qty: 90 | Fill #1

## 2020-01-21 DIAGNOSIS — Z630 Problems in relationship with spouse or partner: Secondary | ICD-10-CM | POA: Diagnosis not present

## 2020-02-02 DIAGNOSIS — Z630 Problems in relationship with spouse or partner: Secondary | ICD-10-CM | POA: Diagnosis not present

## 2020-02-29 MED FILL — ALLOPURINOL 100 MG TABS: 100 | 90 days supply | Qty: 180 | Fill #1

## 2020-02-29 MED FILL — AMLODIPINE BESYLATE 10 MG T: 10 | 90 days supply | Qty: 90 | Fill #1

## 2020-04-15 ENCOUNTER — Encounter: Payer: Self-pay | Admitting: Orthopaedic Surgery

## 2020-04-15 ENCOUNTER — Ambulatory Visit (INDEPENDENT_AMBULATORY_CARE_PROVIDER_SITE_OTHER): Payer: 59 | Admitting: Orthopaedic Surgery

## 2020-04-15 ENCOUNTER — Ambulatory Visit (INDEPENDENT_AMBULATORY_CARE_PROVIDER_SITE_OTHER): Payer: 59

## 2020-04-15 DIAGNOSIS — M25562 Pain in left knee: Secondary | ICD-10-CM

## 2020-04-15 DIAGNOSIS — G8929 Other chronic pain: Secondary | ICD-10-CM

## 2020-04-15 MED ORDER — BUPIVACAINE HCL 0.5 % IJ SOLN
2.0000 mL | INTRAMUSCULAR | Status: AC | PRN
  Administered 2020-04-15: 2 mL via INTRA_ARTICULAR

## 2020-04-15 MED ORDER — LIDOCAINE HCL 1 % IJ SOLN
2.0000 mL | INTRAMUSCULAR | Status: AC | PRN
  Administered 2020-04-15: 2 mL

## 2020-04-15 NOTE — Progress Notes (Signed)
Office Visit Note   Patient: Joseph Ferguson           Date of Birth: [birth year]           MRN: [id number] Visit Date: 04/15/2020              Requested by: Pleas Koch, NP [place],  [place] [id number] PCP: Pleas Koch, NP   Assessment & Plan: Visit Diagnoses:  1. Chronic pain of left knee     Plan: Impression is acute left medial meniscal tear with effusion.  We aspirated 20 cc of joint fluid from his knee today.  Compression wrap applied.  We will need to obtain MRI to evaluate for suspected medial meniscal tear.  I will call the patient with the results of the MRI.  Follow-Up Instructions: Return if symptoms worsen or fail to improve.   Orders:  Orders Placed This Encounter  Procedures   XR Knee Complete 4 Views Left   MR Knee Left w/o contrast   No orders of the defined types were placed in this encounter.     Procedures: Large Joint Inj: L knee on 04/15/2020 11:38 AM Details: 22 G needle Medications: 2 mL bupivacaine 0.5 %; 2 mL lidocaine 1 % Aspirate: 20 mL Outcome: tolerated well, no immediate complications Patient was prepped and draped in the usual sterile fashion.       Clinical Data: No additional findings.   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  He was stretching his knee when he felt a pop and since then he has had very limited range of motion with pain on the medial side.  He has been taking ibuprofen without significant relief.  He has been limping quite a bit.  Denies any numbness and tingling.   Review of Systems  Constitutional: Negative.   All other systems reviewed and are negative.    Objective: Vital Signs: There were no vitals taken for this visit.  Physical Exam Vitals and nursing note reviewed.  Constitutional:      Appearance: He is well-developed.  HENT:      Head: Normocephalic and atraumatic.  Eyes:     Pupils: Pupils are equal, round, and reactive to light.  Pulmonary:     Effort: Pulmonary effort is normal.  Abdominal:     Palpations: Abdomen is soft.  Musculoskeletal:        General: Normal range of motion.     Cervical back: Neck supple.  Skin:    General: Skin is warm.  Neurological:     Mental Status: He is alert and oriented to person, place, and time.  Psychiatric:        Behavior: Behavior normal.        Thought Content: Thought content normal.        Judgment: Judgment normal.     Ortho Exam Left knee shows a large joint effusion.  Painful and decreased range of motion.  Medial joint tenderness.  Collaterals and cruciates are stable.  Pain with McMurray testing. Specialty Comments:  No specialty comments available.  Imaging: XR Knee Complete 4 Views Left  Result Date: 04/15/2020 No acute or structural abnormalities    PMFS History: Patient Active Problem List   Diagnosis Date Noted   Prediabetes 10/12/2019   Preventative health care  12/18/2018   Snoring 12/18/2018   Essential hypertension 08/01/2017   Recurrent major depressive disorder, in full remission ([place]) 08/01/2017   Chronic gout involving toe without tophus 08/01/2017   Depression 07/24/2011   Low HDL (under 40) 07/24/2011   Family history of colon cancer 07/17/2011   History of colonic polyps 07/17/2011   Migraine 07/17/2011   Obesity 07/17/2011   HEMORRHOIDS 12/11/2007   IRRITABLE BOWEL SYNDROME 12/11/2007   COLONIC POLYPS, ADENOMATOUS 10/14/2007   Past Medical History:  Diagnosis Date   Allergy    Depression    Gout    Hypertension    Sickle cell trait (HCC)    Sleep apnea    wears cpap     Family History  Problem Relation Age of Onset   Colon cancer Maternal Grandfather 90   Hypertension Father    Colon polyps Neg Hx    Esophageal cancer Neg Hx    Rectal cancer Neg Hx    Stomach cancer Neg Hx     Past  Surgical History:  Procedure Laterality Date   COLONOSCOPY  2012   POLYPECTOMY     WISDOM TOOTH EXTRACTION     Social History   Occupational History   Not on file  Tobacco Use   Smoking status: Never Smoker   Smokeless tobacco: Never Used  Substance and Sexual Activity   Alcohol use: Yes    Alcohol/week: 0.0 standard drinks    Comment: rarely   Drug use: No   Sexual activity: Yes    Birth control/protection: None

## 2020-04-20 MED FILL — HYDROCHLOROTHIAZIDE 25 MG T: 25 | 90 days supply | Qty: 90 | Fill #2

## 2020-04-20 MED FILL — LOSARTAN POTASSIUM 50 MG TA: 50 | 90 days supply | Qty: 90 | Fill #1

## 2020-05-23 ENCOUNTER — Other Ambulatory Visit: Payer: Self-pay

## 2020-05-23 ENCOUNTER — Ambulatory Visit
Admission: RE | Admit: 2020-05-23 | Discharge: 2020-05-23 | Disposition: A | Discharge status: Home / Self Care | Payer: 59 | Source: Ambulatory Visit | Attending: Orthopaedic Surgery | Admitting: Orthopaedic Surgery

## 2020-05-23 DIAGNOSIS — G8929 Other chronic pain: Secondary | ICD-10-CM

## 2020-05-23 DIAGNOSIS — M25462 Effusion, left knee: Secondary | ICD-10-CM | POA: Diagnosis not present

## 2020-05-23 DIAGNOSIS — M25562 Pain in left knee: Secondary | ICD-10-CM

## 2020-05-23 IMAGING — MR MR KNEE*L* W/O CM
5 of 6 series · 30 of 40 positions shown · non-contrast
Comparison: Radiograph [DATE]

CLINICAL DATA: Left knee pain when walking up stairs.

EXAM:
MRI OF THE LEFT KNEE WITHOUT CONTRAST
TECHNIQUE: Multiplanar, multisequence MR imaging of the knee was performed. No
intravenous contrast was administered.

[Series 3: T2 fat-sat · axial · 4.0mm · 0.62mm/px · z∈[-62,+53]mm · 6 of 24 slices shown (1 of 3)]
[im 1/24]
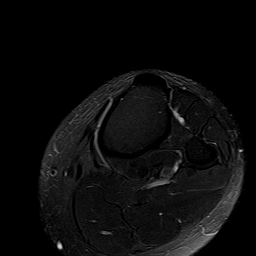
[im 5/24]
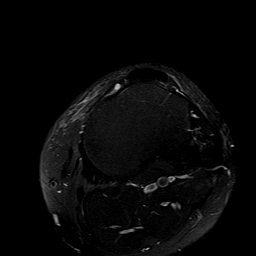
[im 10/24]
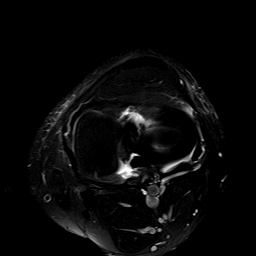
[im 14/24]
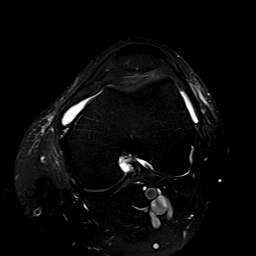
[im 19/24]
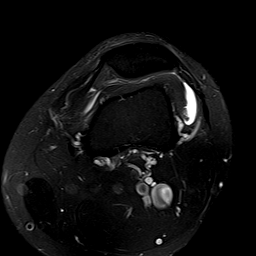
[im 24/24]
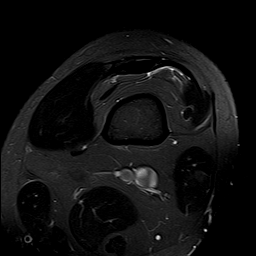

[Series 5: T2 fat-sat · coronal · 4.0mm · 0.29mm/px · 6 of 26 slices shown (2 of 3)]
[im 1/26]
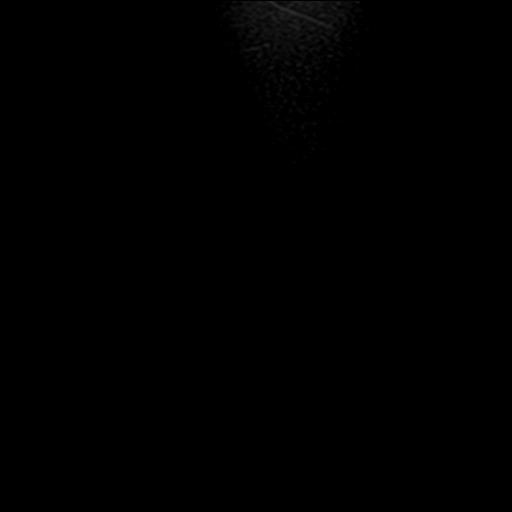
[im 6/26]
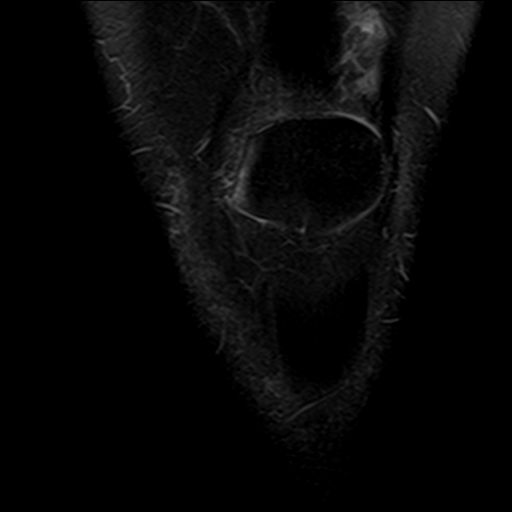
[im 11/26]
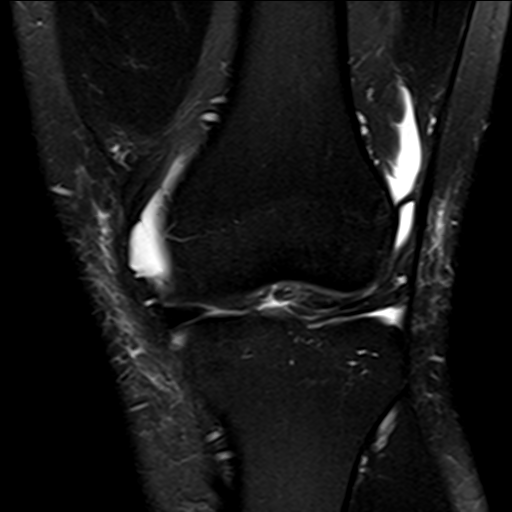
[im 16/26]
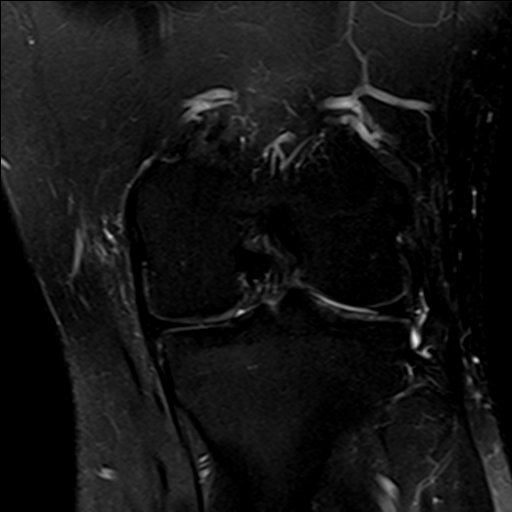
[im 21/26]
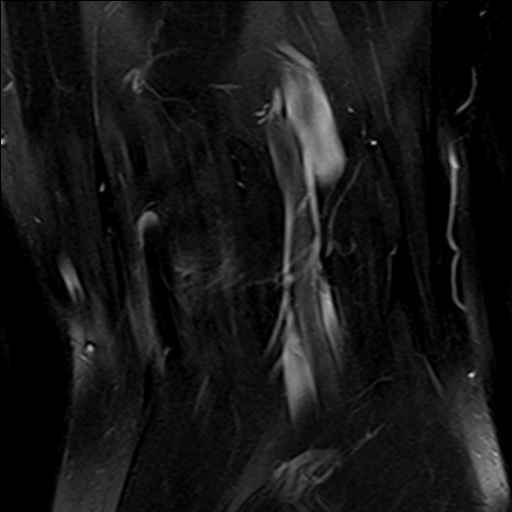
[im 26/26]
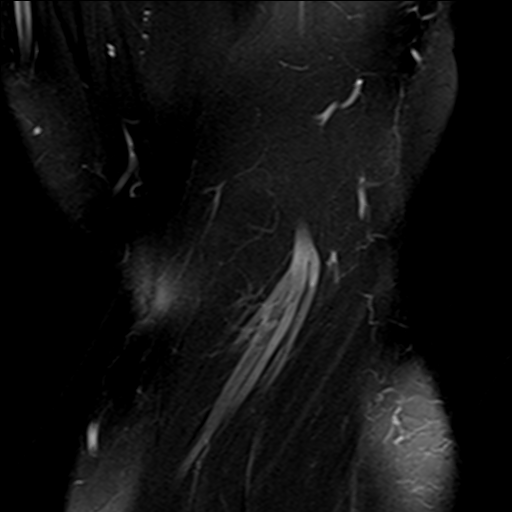

[Series 6: T2 fat-sat · sagittal · 3.0mm · 0.29mm/px · 3 of 31 slices shown (3 of 3)]
[im 1/31]
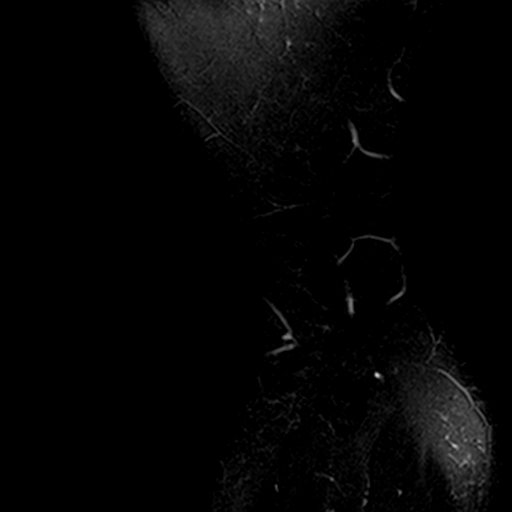
[im 6/31]
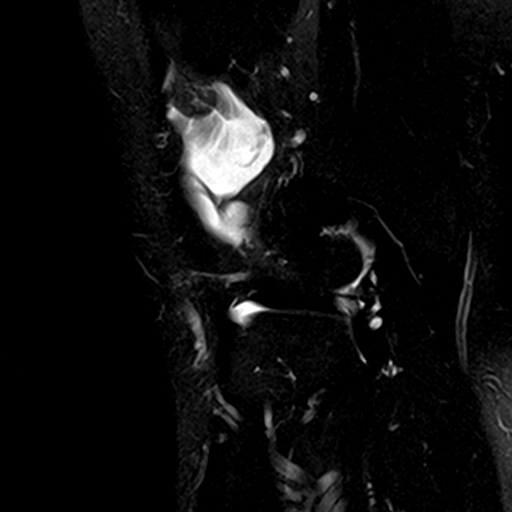
[im 11/31]
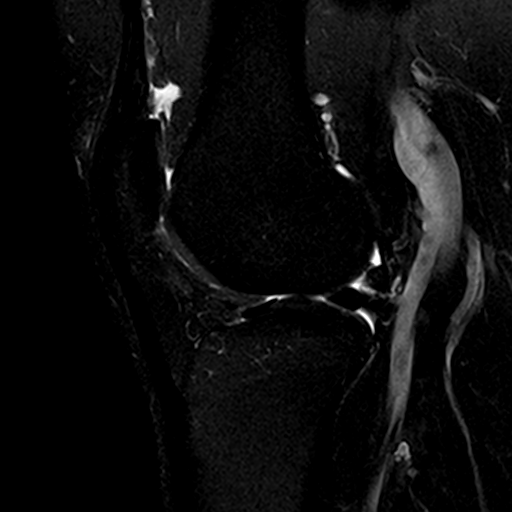

[Series 7: PD fat-sat · sagittal · 3.0mm · 0.39mm/px · 7 of 31 slices shown (1 of 2)]
[im 1/31]
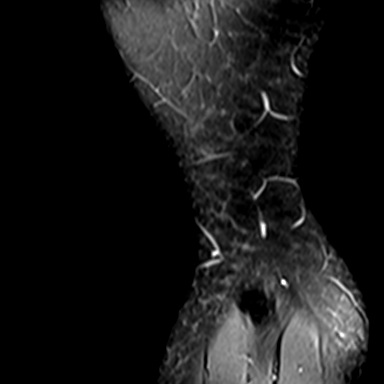
[im 6/31]
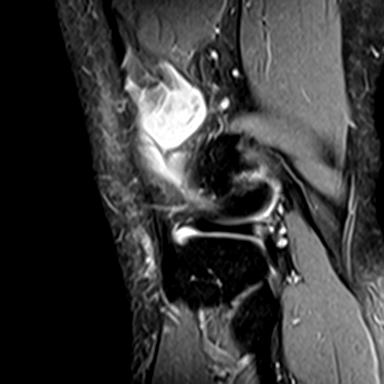
[im 11/31]
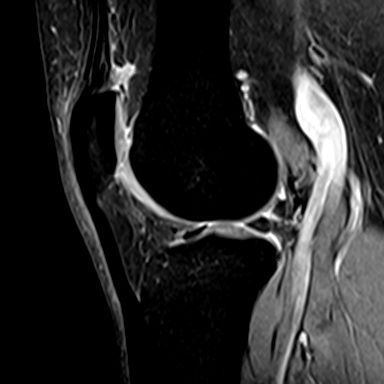
[im 16/31]
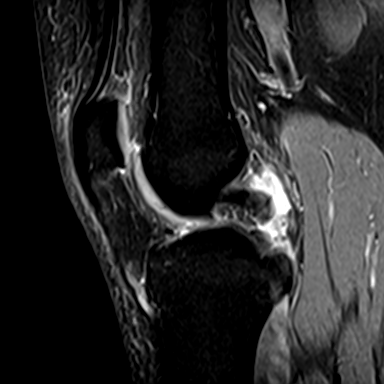
[im 21/31]
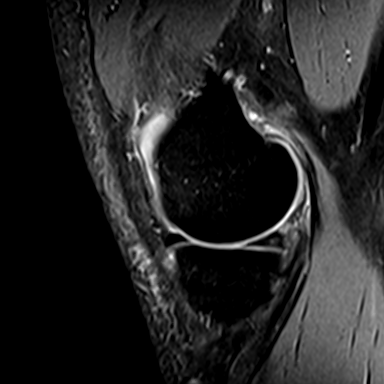
[im 26/31]
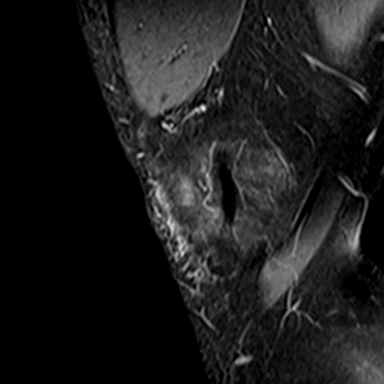
[im 31/31]
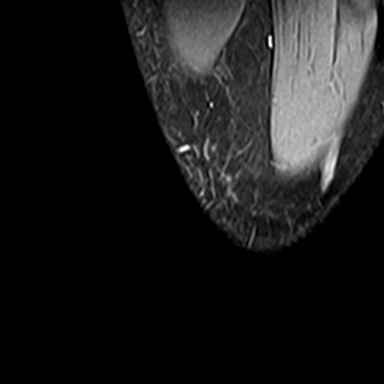

[Series 8: PD fat-sat · coronal · 3.0mm · 0.39mm/px · 8 of 32 slices shown (2 of 2)]
[im 1/32]
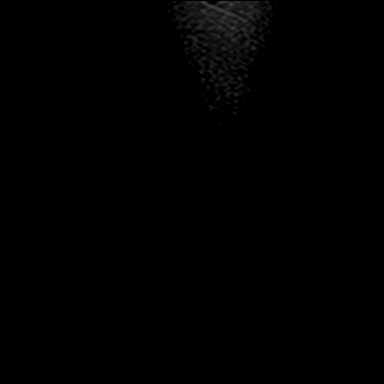
[im 5/32]
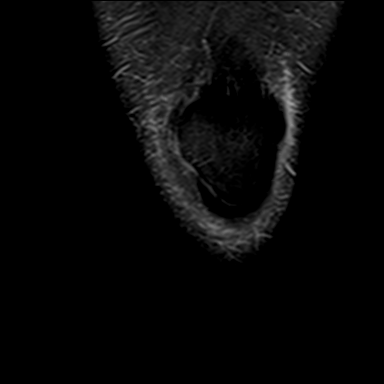
[im 9/32]
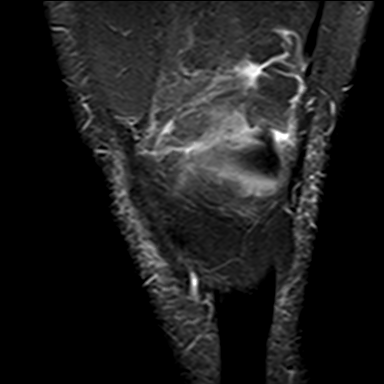
[im 14/32]
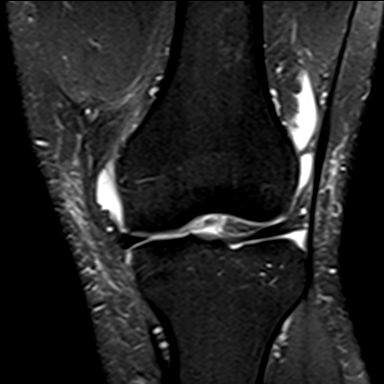
[im 18/32]
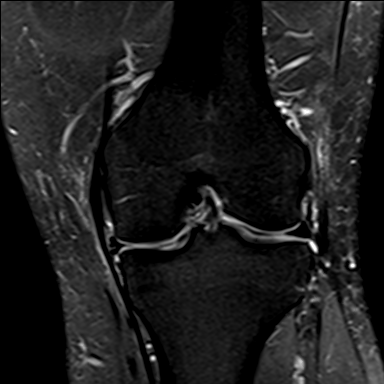
[im 23/32]
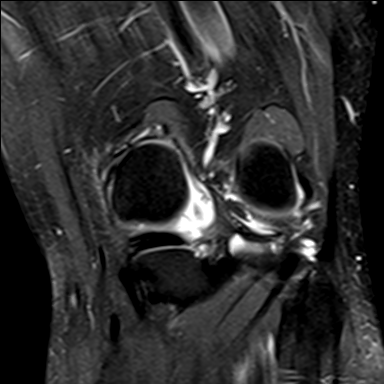
[im 27/32]
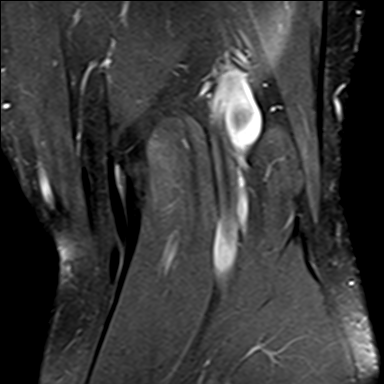
[im 32/32]
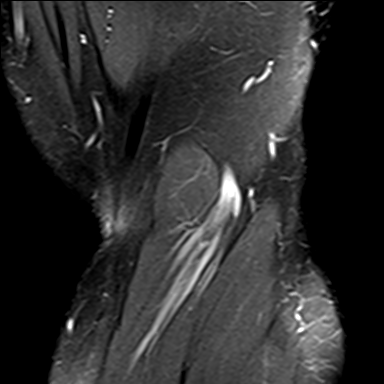

[30 of 40 positions shown; findings below may reference images not displayed]

FINDINGS: MENISCI

Medial meniscus: Intrasubstance degenerative type signal changes but
no discrete meniscal tear.

Lateral meniscus: Intrasubstance degenerative type signal changes
but no discrete tear.

LIGAMENTS

Cruciates:  Intact

Collaterals:  Intact

CARTILAGE

Patellofemoral: Mild degenerative chondrosis. No cartilage defects
or osteochondral lesion.

Medial: Mild to moderate degenerative chondrosis with early joint
space narrowing.

Lateral:  Mild degenerative chondrosis.

Joint:  Small joint effusion.

Popliteal Fossa:  No popliteal mass or Baker's cyst.

Extensor Mechanism: The patella retinacular structures are intact
and the quadriceps and patellar tendons are intact.

Bones:  No acute bony findings.

Other: Unremarkable knee musculature.
IMPRESSION: 1. Intact ligamentous structures and no acute bony findings.
2. No meniscal tears.
3. Mild tricompartmental degenerative chondrosis.
4. Small joint effusion.

## 2020-05-24 ENCOUNTER — Ambulatory Visit (INDEPENDENT_AMBULATORY_CARE_PROVIDER_SITE_OTHER): Payer: 59 | Admitting: Orthopaedic Surgery

## 2020-05-24 ENCOUNTER — Encounter: Payer: Self-pay | Admitting: Orthopaedic Surgery

## 2020-05-24 VITALS — Ht 70.0 in | Wt 248.0 lb

## 2020-05-24 DIAGNOSIS — M25562 Pain in left knee: Secondary | ICD-10-CM

## 2020-05-24 DIAGNOSIS — G8929 Other chronic pain: Secondary | ICD-10-CM | POA: Diagnosis not present

## 2020-05-24 MED ORDER — BUPIVACAINE HCL 0.5 % IJ SOLN
2.0000 mL | INTRAMUSCULAR | Status: AC | PRN
  Administered 2020-05-24: 2 mL via INTRA_ARTICULAR

## 2020-05-24 MED ORDER — LIDOCAINE HCL 1 % IJ SOLN
2.0000 mL | INTRAMUSCULAR | Status: AC | PRN
  Administered 2020-05-24: 2 mL

## 2020-05-24 MED ORDER — METHYLPREDNISOLONE ACETATE 40 MG/ML IJ SUSP
40.0000 mg | INTRAMUSCULAR | Status: AC | PRN
Start: 2020-05-24 — End: 2020-05-24
  Administered 2020-05-24: 40 mg via INTRA_ARTICULAR

## 2020-05-24 NOTE — Progress Notes (Signed)
Office Visit Note   Patient: Joseph Ferguson           Date of Birth: 1976/07/29           MRN: [id number] Visit Date: 05/24/2020              Requested by: Pleas Koch, NP [place],  [place] [id number] PCP: Pleas Koch, NP   Assessment & Plan: Visit Diagnoses:  1. Chronic pain of left knee     Plan: MRI shows internal degenerative changes of the medial meniscus without an acute tear.  He has mild chondromalacia as well.  These findings were reviewed with the patient in detail.  Based on his options he agreed to undergo cortisone injection today.  We will also make a referral to outpatient PT for home exercise and strengthening.  Weight loss would also help with the knee pain which was discussed today.  Follow-Up Instructions: Return if symptoms worsen or fail to improve.   Orders:  Orders Placed This Encounter  Procedures  . Ambulatory referral to Physical Therapy   No orders of the defined types were placed in this encounter.     Procedures: Large Joint Inj: L knee on 05/24/2020 11:17 AM Details: 22 G needle Medications: 2 mL bupivacaine 0.5 %; 2 mL lidocaine 1 %; 40 mg methylPREDNISolone acetate 40 MG/ML Outcome: tolerated well, no immediate complications Patient was prepped and draped in the usual sterile fashion.       Clinical Data: No additional findings.   Subjective: Chief Complaint  Patient presents with  . Left Knee - Follow-up    Patient returns today for MRI review of the left knee.  He had an MRI done yesterday.  The aspiration was somewhat helpful.  He has some weakness and trouble going up stairs.   Review of Systems   Objective: Vital Signs: Ht 5\' 10"  (1.778 m)   Wt (!) 248 lb (112.5 kg)   BMI 35.58 kg/m   Physical Exam  Ortho Exam Left knee shows a trace joint effusion.  No signs of infection. Specialty Comments:  No specialty comments available.  Imaging: No results found.   PMFS History: Patient  Active Problem List   Diagnosis Date Noted  . Prediabetes 10/12/2019  . Preventative health care 12/18/2018  . Snoring 12/18/2018  . Essential hypertension 08/01/2017  . Recurrent major depressive disorder, in full remission ([place]) 08/01/2017  . Chronic gout involving toe without tophus 08/01/2017  . Depression 07/24/2011  . Low HDL (under 40) 07/24/2011  . Family history of colon cancer 07/17/2011  . History of colonic polyps 07/17/2011  . Migraine 07/17/2011  . Obesity 07/17/2011  . HEMORRHOIDS 12/11/2007  . IRRITABLE BOWEL SYNDROME 12/11/2007  . COLONIC POLYPS, ADENOMATOUS 10/14/2007   Past Medical History:  Diagnosis Date  . Allergy   . Depression   . Gout   . Hypertension   . Sickle cell trait ([place])   . Sleep apnea    wears cpap     Family History  Problem Relation Age of Onset  . Colon cancer Maternal Grandfather 90  . Hypertension Father   . Colon polyps Neg Hx   . Esophageal cancer Neg Hx   . Rectal cancer Neg Hx   . Stomach cancer Neg Hx     Past Surgical History:  Procedure Laterality Date  . COLONOSCOPY  2012  . POLYPECTOMY    . [place] EXTRACTION     Social  History   Occupational History  . Not on file  Tobacco Use  . Smoking status: Never Smoker  . Smokeless tobacco: Never Used  Substance and Sexual Activity  . Alcohol use: Yes    Alcohol/week: 0.0 standard drinks    Comment: rarely  . Drug use: No  . Sexual activity: Yes    Birth control/protection: None

## 2020-06-02 ENCOUNTER — Other Ambulatory Visit: Payer: Self-pay | Admitting: Orthopaedic Surgery

## 2020-06-02 MED ORDER — MELOXICAM 7.5 MG PO TABS: 7.5000 mg | ORAL_TABLET | Freq: Two times a day (BID) | ORAL | 2 refills | Status: DC | PRN

## 2020-06-02 MED ORDER — METHYLPREDNISOLONE 4 MG PO TBPK: ORAL_TABLET | ORAL | 0 refills | Status: DC

## 2020-06-02 MED FILL — METHYLPREDNISOLONE 4 MG TAB: 4 | 6 days supply | Qty: 21 | Fill #0

## 2020-06-02 MED FILL — MELOXICAM 7.5 MG TABLET: 7.5 | 15 days supply | Qty: 30 | Fill #0

## 2020-06-03 ENCOUNTER — Other Ambulatory Visit: Payer: Self-pay | Admitting: Primary Care

## 2020-06-03 DIAGNOSIS — F3342 Major depressive disorder, recurrent, in full remission: Secondary | ICD-10-CM

## 2020-06-03 MED FILL — AMLODIPINE BESYLATE 10 MG T: 10 | 90 days supply | Qty: 90 | Fill #2

## 2020-06-03 MED FILL — VENLAFAXINE HCL ER 37.5 MG: 37.5 | 30 days supply | Qty: 30 | Fill #0

## 2020-07-07 ENCOUNTER — Encounter: Payer: Self-pay | Admitting: Primary Care

## 2020-07-07 ENCOUNTER — Other Ambulatory Visit: Payer: Self-pay | Admitting: Primary Care

## 2020-07-07 ENCOUNTER — Other Ambulatory Visit: Payer: Self-pay

## 2020-07-07 ENCOUNTER — Ambulatory Visit: Payer: 59 | Admitting: Primary Care

## 2020-07-07 VITALS — BP 124/84 | HR 65 | Ht 70.0 in | Wt 256.0 lb

## 2020-07-07 DIAGNOSIS — I10 Essential (primary) hypertension: Secondary | ICD-10-CM

## 2020-07-07 DIAGNOSIS — E669 Obesity, unspecified: Secondary | ICD-10-CM | POA: Diagnosis not present

## 2020-07-07 DIAGNOSIS — F3342 Major depressive disorder, recurrent, in full remission: Secondary | ICD-10-CM

## 2020-07-07 DIAGNOSIS — M1A9XX0 Chronic gout, unspecified, without tophus (tophi): Secondary | ICD-10-CM | POA: Diagnosis not present

## 2020-07-07 DIAGNOSIS — Z113 Encounter for screening for infections with a predominantly sexual mode of transmission: Secondary | ICD-10-CM | POA: Diagnosis not present

## 2020-07-07 DIAGNOSIS — R7303 Prediabetes: Secondary | ICD-10-CM | POA: Diagnosis not present

## 2020-07-07 MED ORDER — VENLAFAXINE HCL ER 37.5 MG PO CP24: ORAL_CAPSULE | ORAL | 3 refills | Status: DC

## 2020-07-07 MED FILL — VENLAFAXINE HCL ER 37.5 MG: 37.5 | 90 days supply | Qty: 90 | Fill #0

## 2020-07-07 NOTE — Assessment & Plan Note (Signed)
Chronic, difficulty losing weight despite alterations in diet and activity.  Recommended to increase physical activity as tolerated. Work on diet, try to make a change in something.   Discussed health weight [facility] wellness center in [place].  Checking labs for which are pending.

## 2020-07-07 NOTE — Patient Instructions (Addendum)
Schedule a lab appointment to return between 7:30 am and 10 am any day.  [facility] in [place].  Start exercising. You should be getting 150 minutes of moderate intensity exercise weekly.  It was a pleasure to see you today!

## 2020-07-07 NOTE — Assessment & Plan Note (Signed)
Well controlled in the office today, continue HCTZ and losartan. CMP pending.

## 2020-07-07 NOTE — Progress Notes (Signed)
Subjective:    Patient ID: Joseph Ferguson, male    DOB: [birth year], 44 y.o.   MRN: [id number]  HPI  This visit occurred during the SARS-CoV-2 public health emergency.  Safety protocols were in place, including screening questions prior to the visit, additional usage of staff PPE, and extensive cleaning of exam room while observing appropriate contact time as indicated for disinfecting solutions.   Joseph Ferguson is a 44 year old male with a history of hypertension, migraines, IBS, chronic gout, MDD, prediabetes who presents today for medication refills and to discuss obesity.  Currently managed on amlodipine 10 mg and losartan 50 mg.  Due for refills of venlafaxine ER 37.5, doing well overall.   He is frustrated with his weight, has tried limiting calories for three weeks with a low carb diet, but has not been able to lose weight. [name] fluctuated within 10 pounds for years, has never been able to get below that.   Diet currently consists of:  Breakfast: Eggs, sausage Lunch: Protein, vegetable, fruit (cafeteria) Dinner: Protein, vegetable, starch (rice/sweet potato) Snacks: Peanut butter, cheese it's Desserts: Frequent sweets, but has cut back to 2-3 days weekly.  Beverages: Water, Vitamin Water Zero, infrequent alcohol.   Exercise: He is not exercising much due to knee issues. Will sometimes do a 15 minute work out times weekly.   Wt Readings from Last 3 Encounters:  07/07/20 256 lb (116.1 kg)  05/24/20 (!) 248 lb (112.5 kg)  11/30/19 258 lb (117 kg)     BP Readings from Last 3 Encounters:  07/07/20 124/84  11/30/19 (!) 123/93  11/09/19 116/76     Review of Systems  Eyes: Negative for visual disturbance.  Respiratory: Negative for shortness of breath.   Cardiovascular: Negative for chest pain.  Neurological: Negative for dizziness and headaches.  Psychiatric/Behavioral:       Doing well on venlafaxine        Past Medical History:  Diagnosis Date   Allergy     Depression    Gout    Hypertension    Sickle cell trait (HCC)    Sleep apnea    wears cpap      Social History   Socioeconomic History   Marital status: Divorced    Spouse name: Not on file   Number of children: Not on file   Years of education: Not on file   Highest education level: Not on file  Occupational History   Not on file  Tobacco Use   Smoking status: Never Smoker   Smokeless tobacco: Never Used  Substance and Sexual Activity   Alcohol use: Yes    Alcohol/week: 0.0 standard drinks    Comment: rarely   Drug use: No   Sexual activity: Yes    Birth control/protection: None  Other Topics Concern   Not on file  Social History Narrative   Divorced.   2 children.   CRNA, works for [employer].    Enjoys spending time with children, relaxing.    Social Determinants of Health   Financial Resource Strain:    Difficulty of Paying Living Expenses: Not on file  Food Insecurity:    Worried About Charity fundraiser in the Last Year: Not on file   [employer] of Food in the Last Year: Not on file  Transportation Needs:    Lack of Transportation (Medical): Not on file   Lack of Transportation (Non-Medical): Not on file  Physical Activity:    Days of Exercise  per Week: Not on file   Minutes of Exercise per Session: Not on file  Stress:    Feeling of Stress : Not on file  Social Connections:    Frequency of Communication with Friends and Family: Not on file   Frequency of Social Gatherings with Friends and Family: Not on file   Attends Religious Services: Not on file   Active Member of Clubs or Organizations: Not on file   Attends Archivist Meetings: Not on file   Marital Status: Not on file  Intimate Partner Violence:    Fear of Current or Ex-Partner: Not on file   Emotionally Abused: Not on file   Physically Abused: Not on file   Sexually Abused: Not on file    Past Surgical History:  Procedure Laterality Date    COLONOSCOPY  2012   POLYPECTOMY     WISDOM TOOTH EXTRACTION      Family History  Problem Relation Age of Onset   Colon cancer Maternal Grandfather 42   Hypertension Father    Colon polyps Neg Hx    Esophageal cancer Neg Hx    Rectal cancer Neg Hx    Stomach cancer Neg Hx     Allergies  Allergen Reactions   [place]fish Allergy Swelling    Current Outpatient Medications on File Prior to Visit  Medication Sig Dispense Refill   allopurinol (ZYLOPRIM) 100 MG tablet Take 2 tablets (200 mg total) by mouth daily. For gout prevention. 180 tablet 3   amLODipine (NORVASC) 10 MG tablet Take 1 tablet (10 mg total) by mouth daily. For blood pressure. 90 tablet 3   hydrochlorothiazide (HYDRODIURIL) 25 MG tablet Take 1 tablet (25 mg total) by mouth daily. For blood pressure. 90 tablet 3   losartan (COZAAR) 50 MG tablet TAKE 1 TABLET (50 MG TOTAL) BY MOUTH DAILY FOR BLOOD PRESSURE. 90 tablet 1   meloxicam (MOBIC) 7.5 MG tablet Take 1 tablet (7.5 mg total) by mouth 2 (two) times daily as needed for pain. Take once you have finished the prednisone dose pak 30 tablet 2   Multiple Vitamin (MULTIVITAMIN PO) Take by mouth daily. [facility] multivitamin     No current facility-administered medications on file prior to visit.    BP 124/84    Pulse (!) 51    Ht 5\' 10"  (1.778 m)    Wt 256 lb (116.1 kg)    SpO2 95%    BMI 36.73 kg/m    Objective:   Physical Exam Cardiovascular:     Rate and Rhythm: Normal rate and regular rhythm.  Pulmonary:     Effort: Pulmonary effort is normal.     Breath sounds: Normal breath sounds.  Musculoskeletal:     Cervical back: Neck supple.  Skin:    General: Skin is warm and dry.  Psychiatric:        Mood and Affect: Mood normal.            Assessment & Plan:

## 2020-07-07 NOTE — Assessment & Plan Note (Signed)
No recent flares. Continue allopurinol. Repeat uric acid level pending.

## 2020-07-07 NOTE — Assessment & Plan Note (Signed)
Encouraged a healthy diet, regular exercise.  Repeat A1C pending.

## 2020-07-07 NOTE — Assessment & Plan Note (Signed)
Doing well on venlafaxine, continue same. Refills provided.

## 2020-07-19 ENCOUNTER — Other Ambulatory Visit (INDEPENDENT_AMBULATORY_CARE_PROVIDER_SITE_OTHER): Payer: 59

## 2020-07-19 ENCOUNTER — Other Ambulatory Visit: Payer: Self-pay

## 2020-07-19 DIAGNOSIS — M1A9XX0 Chronic gout, unspecified, without tophus (tophi): Secondary | ICD-10-CM | POA: Diagnosis not present

## 2020-07-19 DIAGNOSIS — I10 Essential (primary) hypertension: Secondary | ICD-10-CM | POA: Diagnosis not present

## 2020-07-19 DIAGNOSIS — R7303 Prediabetes: Secondary | ICD-10-CM

## 2020-07-19 DIAGNOSIS — E669 Obesity, unspecified: Secondary | ICD-10-CM | POA: Diagnosis not present

## 2020-07-19 DIAGNOSIS — Z113 Encounter for screening for infections with a predominantly sexual mode of transmission: Secondary | ICD-10-CM | POA: Diagnosis not present

## 2020-07-19 LAB — COMPREHENSIVE METABOLIC PANEL
ALT: 28 U/L (ref 0–53)
AST: 17 U/L (ref 0–37)
Albumin: 4.1 g/dL (ref 3.5–5.2)
Alkaline Phosphatase: 53 U/L (ref 39–117)
BUN: 18 mg/dL (ref 6–23)
CO2: 34 mEq/L — ABNORMAL HIGH (ref 19–32)
Calcium: 9.8 mg/dL (ref 8.4–10.5)
Chloride: 102 mEq/L (ref 96–112)
Creatinine, Ser: 1.32 mg/dL (ref 0.40–1.50)
GFR: 71.09 mL/min (ref >60.00)
Glucose, Bld: 92 mg/dL (ref 70–99)
Potassium: 4 mEq/L (ref 3.5–5.1)
Sodium: 141 mEq/L (ref 135–145)
Total Bilirubin: 0.5 mg/dL (ref 0.2–1.2)
Total Protein: 7.3 g/dL (ref 6.0–8.3)

## 2020-07-19 LAB — LIPID PANEL
Cholesterol: 201 mg/dL — ABNORMAL HIGH (ref 0–200)
HDL: 36.7 mg/dL — ABNORMAL LOW (ref >39.00)
LDL Cholesterol: 147 mg/dL — ABNORMAL HIGH (ref 0–99)
NonHDL: 164.74
Total CHOL/HDL Ratio: 5
Triglycerides: 89 mg/dL (ref 0.0–149.0)
VLDL: 17.8 mg/dL (ref 0.0–40.0)

## 2020-07-19 LAB — TSH: TSH: 0.69 u[IU]/mL (ref 0.35–4.50)

## 2020-07-19 LAB — HEMOGLOBIN A1C: Hgb A1c MFr Bld: 6.2 % (ref 4.6–6.5)

## 2020-07-19 LAB — TESTOSTERONE: Testosterone: 250.39 ng/dL — ABNORMAL LOW (ref 300.00–890.00)

## 2020-07-19 LAB — VITAMIN D 25 HYDROXY (VIT D DEFICIENCY, FRACTURES): VITD: 25.52 ng/mL — ABNORMAL LOW (ref 30.00–100.00)

## 2020-07-19 LAB — URIC ACID: Uric Acid, Serum: 8.5 mg/dL — ABNORMAL HIGH (ref 4.0–7.8)

## 2020-07-19 NOTE — Addendum Note (Signed)
Addended by: Cloyd Stagers on: 07/19/2020 11:24 AM   Modules accepted: Orders

## 2020-07-20 LAB — HEPATITIS C ANTIBODY
Hepatitis C Ab: NONREACTIVE
SIGNAL TO CUT-OFF: 0.1 (ref <1.00)

## 2020-07-20 LAB — HIV ANTIBODY (ROUTINE TESTING W REFLEX): HIV 1&2 Ab, 4th Generation: NONREACTIVE

## 2020-07-20 LAB — C. TRACHOMATIS/N. GONORRHOEAE RNA
C. trachomatis RNA, TMA: NOT DETECTED
N. gonorrhoeae RNA, TMA: NOT DETECTED

## 2020-07-20 LAB — RPR: RPR Ser Ql: NONREACTIVE

## 2020-07-22 MED FILL — HYDROCHLOROTHIAZIDE 25 MG T: 25 | 90 days supply | Qty: 90 | Fill #3

## 2020-07-22 MED FILL — ALLOPURINOL 100 MG TABS: 100 | 90 days supply | Qty: 180 | Fill #2

## 2020-08-04 ENCOUNTER — Encounter: Payer: Self-pay | Admitting: Orthopaedic Surgery

## 2020-08-04 ENCOUNTER — Ambulatory Visit (INDEPENDENT_AMBULATORY_CARE_PROVIDER_SITE_OTHER): Payer: 59 | Admitting: Orthopaedic Surgery

## 2020-08-04 VITALS — Ht 70.0 in | Wt 254.0 lb

## 2020-08-04 DIAGNOSIS — M25562 Pain in left knee: Secondary | ICD-10-CM

## 2020-08-04 DIAGNOSIS — G8929 Other chronic pain: Secondary | ICD-10-CM

## 2020-08-04 MED ORDER — LIDOCAINE HCL 1 % IJ SOLN
2.0000 mL | INTRAMUSCULAR | Status: AC | PRN
  Administered 2020-08-04: 2 mL

## 2020-08-04 MED ORDER — METHYLPREDNISOLONE ACETATE 40 MG/ML IJ SUSP
40.0000 mg | INTRAMUSCULAR | Status: AC | PRN
  Administered 2020-08-04: 40 mg via INTRA_ARTICULAR

## 2020-08-04 MED ORDER — BUPIVACAINE HCL 0.5 % IJ SOLN
2.0000 mL | INTRAMUSCULAR | Status: AC | PRN
Start: 2020-08-04 — End: 2020-08-04
  Administered 2020-08-04: 2 mL via INTRA_ARTICULAR

## 2020-08-04 NOTE — Progress Notes (Signed)
Office Visit Note   Patient: Joseph Ferguson           Date of Birth: 1975-11-08           MRN: [id number] Visit Date: 08/04/2020              Requested by: Pleas Koch, NP [place],  [place] [id number] PCP: Pleas Koch, NP   Assessment & Plan: Visit Diagnoses:  1. Chronic pain of left knee     Plan: Impression is recurrent left knee pain.  MRI reviewed today again.  Aspiration attempted but yielded little.  Injection performed today.  Activity modification.  Overall I think that this will be a self-limiting process.  Follow-Up Instructions: Return if symptoms worsen or fail to improve.   Orders:  No orders of the defined types were placed in this encounter.  No orders of the defined types were placed in this encounter.     Procedures: Large Joint Inj: L knee on 08/04/2020 9:44 AM Details: 22 G needle Medications: 2 mL bupivacaine 0.5 %; 2 mL lidocaine 1 %; 40 mg methylPREDNISolone acetate 40 MG/ML Outcome: tolerated well, no immediate complications Patient was prepped and draped in the usual sterile fashion.       Clinical Data: No additional findings.   Subjective: Chief Complaint  Patient presents with  . Left Knee - Pain    Patient returns today for recurrent left knee pain and swelling.  He has known meniscal degeneration on MRI earlier this year.  He denies any injuries.  He has been doing stationary bike recently.   Review of Systems   Objective: Vital Signs: Ht 5\' 10"  (1.778 m)   Wt 254 lb (115.2 kg)   BMI 36.45 kg/m   Physical Exam  Ortho Exam Left knee shows a trace joint effusion.  No other findings otherwise. Specialty Comments:  No specialty comments available.  Imaging: No results found.   PMFS History: Patient Active Problem List   Diagnosis Date Noted  . Prediabetes 10/12/2019  . Preventative health care 12/18/2018  . Snoring 12/18/2018  . Essential hypertension 08/01/2017  . Recurrent major  depressive disorder, in full remission ([place]) 08/01/2017  . Chronic gout involving toe without tophus 08/01/2017  . Low HDL (under 40) 07/24/2011  . Family history of colon cancer 07/17/2011  . History of colonic polyps 07/17/2011  . Migraine 07/17/2011  . Obesity (BMI 30-39.9) 07/17/2011  . HEMORRHOIDS 12/11/2007  . IRRITABLE BOWEL SYNDROME 12/11/2007  . COLONIC POLYPS, ADENOMATOUS 10/14/2007   Past Medical History:  Diagnosis Date  . Allergy   . Depression   . Gout   . Hypertension   . Sickle cell trait ([place])   . Sleep apnea    wears cpap     Family History  Problem Relation Age of Onset  . Colon cancer Maternal Grandfather 90  . Hypertension Father   . Colon polyps Neg Hx   . Esophageal cancer Neg Hx   . Rectal cancer Neg Hx   . Stomach cancer Neg Hx     Past Surgical History:  Procedure Laterality Date  . COLONOSCOPY  2012  . POLYPECTOMY    . WISDOM TOOTH EXTRACTION     Social History   Occupational History  . Not on file  Tobacco Use  . Smoking status: Never Smoker  . Smokeless tobacco: Never Used  Substance and Sexual Activity  . Alcohol use: Yes    Alcohol/week: 0.0 standard  drinks    Comment: rarely  . Drug use: No  . Sexual activity: Yes    Birth control/protection: None

## 2020-09-09 ENCOUNTER — Other Ambulatory Visit: Payer: Self-pay | Admitting: Primary Care

## 2020-09-09 DIAGNOSIS — I10 Essential (primary) hypertension: Secondary | ICD-10-CM

## 2020-09-09 MED FILL — AMLODIPINE BESYLATE 10 MG T: 10 | 90 days supply | Qty: 90 | Fill #3

## 2020-09-09 MED FILL — MELOXICAM 7.5 MG TABLET: 7.5 | 15 days supply | Qty: 30 | Fill #1

## 2020-09-12 ENCOUNTER — Other Ambulatory Visit: Payer: Self-pay | Admitting: Primary Care

## 2020-09-12 MED FILL — LOSARTAN POTASSIUM 50 MG TA: 50 | 90 days supply | Qty: 90 | Fill #0

## 2020-10-04 MED FILL — VENLAFAXINE HCL ER 37.5 MG: 37.5 | 90 days supply | Qty: 90 | Fill #1

## 2020-10-24 ENCOUNTER — Other Ambulatory Visit: Payer: Self-pay | Admitting: Primary Care

## 2020-10-24 DIAGNOSIS — I10 Essential (primary) hypertension: Secondary | ICD-10-CM

## 2020-10-24 MED FILL — ALLOPURINOL 100 MG TABS: 100 | 90 days supply | Qty: 180 | Fill #3

## 2020-10-25 ENCOUNTER — Other Ambulatory Visit: Payer: Self-pay | Admitting: Primary Care

## 2020-10-25 MED FILL — HYDROCHLOROTHIAZIDE 25 MG T: 25 | 90 days supply | Qty: 90 | Fill #0

## 2020-12-05 ENCOUNTER — Other Ambulatory Visit: Payer: Self-pay | Admitting: Primary Care

## 2020-12-05 DIAGNOSIS — I10 Essential (primary) hypertension: Secondary | ICD-10-CM

## 2020-12-05 MED FILL — AMLODIPINE BESYLATE 10 MG T: 10 | 90 days supply | Qty: 90 | Fill #0

## 2020-12-05 MED FILL — LOSARTAN POTASSIUM 50 MG TA: 50 | 30 days supply | Qty: 30 | Fill #1

## 2020-12-28 MED FILL — VENLAFAXINE HCL ER 37.5 MG: 37.5 | 90 days supply | Qty: 90 | Fill #2

## 2021-01-26 ENCOUNTER — Other Ambulatory Visit: Payer: Self-pay | Admitting: Primary Care

## 2021-01-26 DIAGNOSIS — M1A9XX0 Chronic gout, unspecified, without tophus (tophi): Secondary | ICD-10-CM

## 2021-01-26 MED FILL — LOSARTAN POTASSIUM 50 MG TA: 50 | 30 days supply | Qty: 30 | Fill #2

## 2021-01-26 MED FILL — CARESTART COVID-19 HOME TES: 4 days supply | Qty: 4 | Fill #0

## 2021-01-26 MED FILL — HYDROCHLOROTHIAZIDE 25 MG T: 25 | 90 days supply | Qty: 90 | Fill #1

## 2021-02-20 DIAGNOSIS — H52223 Regular astigmatism, bilateral: Secondary | ICD-10-CM | POA: Diagnosis not present

## 2021-03-06 ENCOUNTER — Other Ambulatory Visit (HOSPITAL_COMMUNITY): Payer: Self-pay

## 2021-03-06 ENCOUNTER — Other Ambulatory Visit: Payer: Self-pay | Admitting: Primary Care

## 2021-03-06 DIAGNOSIS — I10 Essential (primary) hypertension: Secondary | ICD-10-CM

## 2021-03-06 MED ORDER — LOSARTAN POTASSIUM 50 MG PO TABS
50.0000 mg | ORAL_TABLET | Freq: Every day | ORAL | 0 refills | Status: DC
  Filled 2021-03-06: qty 90, 90d supply, fill #0

## 2021-03-06 MED FILL — Losartan Potassium Tab 50 MG: ORAL | 30 days supply | Qty: 30 | Fill #0 | Status: CN

## 2021-03-06 MED FILL — Allopurinol Tab 100 MG: ORAL | 90 days supply | Qty: 180 | Fill #0 | Status: AC

## 2021-04-04 ENCOUNTER — Other Ambulatory Visit (HOSPITAL_COMMUNITY): Payer: Self-pay

## 2021-04-04 MED FILL — Venlafaxine HCl Cap ER 24HR 37.5 MG (Base Equivalent): ORAL | 90 days supply | Qty: 90 | Fill #0 | Status: AC

## 2021-04-16 MED FILL — Amlodipine Besylate Tab 10 MG (Base Equivalent): ORAL | 90 days supply | Qty: 90 | Fill #0 | Status: AC

## 2021-04-17 ENCOUNTER — Other Ambulatory Visit (HOSPITAL_COMMUNITY): Payer: Self-pay

## 2021-05-18 ENCOUNTER — Other Ambulatory Visit (HOSPITAL_COMMUNITY): Payer: Self-pay

## 2021-05-18 MED FILL — Hydrochlorothiazide Tab 25 MG: ORAL | 90 days supply | Qty: 90 | Fill #0 | Status: AC

## 2021-06-21 ENCOUNTER — Ambulatory Visit: Payer: 59 | Admitting: Primary Care

## 2021-06-21 ENCOUNTER — Other Ambulatory Visit: Payer: Self-pay

## 2021-06-21 ENCOUNTER — Encounter: Payer: Self-pay | Admitting: Primary Care

## 2021-06-21 ENCOUNTER — Other Ambulatory Visit (HOSPITAL_COMMUNITY): Payer: Self-pay

## 2021-06-21 ENCOUNTER — Ambulatory Visit (INDEPENDENT_AMBULATORY_CARE_PROVIDER_SITE_OTHER): Payer: 59

## 2021-06-21 ENCOUNTER — Ambulatory Visit (INDEPENDENT_AMBULATORY_CARE_PROVIDER_SITE_OTHER): Payer: 59 | Admitting: Podiatry

## 2021-06-21 VITALS — BP 126/82 | HR 56 | Temp 97.7°F | Ht 70.0 in | Wt 267.0 lb

## 2021-06-21 DIAGNOSIS — R7989 Other specified abnormal findings of blood chemistry: Secondary | ICD-10-CM | POA: Insufficient documentation

## 2021-06-21 DIAGNOSIS — M205X1 Other deformities of toe(s) (acquired), right foot: Secondary | ICD-10-CM

## 2021-06-21 DIAGNOSIS — Z0000 Encounter for general adult medical examination without abnormal findings: Secondary | ICD-10-CM | POA: Diagnosis not present

## 2021-06-21 DIAGNOSIS — Z23 Encounter for immunization: Secondary | ICD-10-CM

## 2021-06-21 DIAGNOSIS — M1A9XX0 Chronic gout, unspecified, without tophus (tophi): Secondary | ICD-10-CM | POA: Diagnosis not present

## 2021-06-21 DIAGNOSIS — M79674 Pain in right toe(s): Secondary | ICD-10-CM

## 2021-06-21 DIAGNOSIS — R7303 Prediabetes: Secondary | ICD-10-CM

## 2021-06-21 DIAGNOSIS — Z800 Family history of malignant neoplasm of digestive organs: Secondary | ICD-10-CM

## 2021-06-21 DIAGNOSIS — I10 Essential (primary) hypertension: Secondary | ICD-10-CM

## 2021-06-21 DIAGNOSIS — E559 Vitamin D deficiency, unspecified: Secondary | ICD-10-CM | POA: Insufficient documentation

## 2021-06-21 DIAGNOSIS — F3342 Major depressive disorder, recurrent, in full remission: Secondary | ICD-10-CM | POA: Diagnosis not present

## 2021-06-21 DIAGNOSIS — E785 Hyperlipidemia, unspecified: Secondary | ICD-10-CM

## 2021-06-21 DIAGNOSIS — Z113 Encounter for screening for infections with a predominantly sexual mode of transmission: Secondary | ICD-10-CM | POA: Diagnosis not present

## 2021-06-21 DIAGNOSIS — D126 Benign neoplasm of colon, unspecified: Secondary | ICD-10-CM

## 2021-06-21 LAB — COMPREHENSIVE METABOLIC PANEL
ALT: 43 U/L (ref 0–53)
AST: 64 U/L — ABNORMAL HIGH (ref 0–37)
Albumin: 4 g/dL (ref 3.5–5.2)
Alkaline Phosphatase: 64 U/L (ref 39–117)
BUN: 14 mg/dL (ref 6–23)
CO2: 30 mEq/L (ref 19–32)
Calcium: 9.5 mg/dL (ref 8.4–10.5)
Chloride: 104 mEq/L (ref 96–112)
Creatinine, Ser: 1.26 mg/dL (ref 0.40–1.50)
GFR: 68.86 mL/min (ref >60.00)
Glucose, Bld: 92 mg/dL (ref 70–99)
Potassium: 3.8 mEq/L (ref 3.5–5.1)
Sodium: 142 mEq/L (ref 135–145)
Total Bilirubin: 0.5 mg/dL (ref 0.2–1.2)
Total Protein: 7.1 g/dL (ref 6.0–8.3)

## 2021-06-21 LAB — LIPID PANEL
Cholesterol: 158 mg/dL (ref 0–200)
HDL: 35.4 mg/dL — ABNORMAL LOW (ref >39.00)
LDL Cholesterol: 106 mg/dL — ABNORMAL HIGH (ref 0–99)
NonHDL: 122.42
Total CHOL/HDL Ratio: 4
Triglycerides: 83 mg/dL (ref 0.0–149.0)
VLDL: 16.6 mg/dL (ref 0.0–40.0)

## 2021-06-21 LAB — HEMOGLOBIN A1C: Hgb A1c MFr Bld: 6.1 % (ref 4.6–6.5)

## 2021-06-21 LAB — CBC
HCT: 41.5 % (ref 39.0–52.0)
Hemoglobin: 13.6 g/dL (ref 13.0–17.0)
MCHC: 32.9 g/dL (ref 30.0–36.0)
MCV: 83 fl (ref 78.0–100.0)
Platelets: 194 10*3/uL (ref 150.0–400.0)
RBC: 5 Mil/uL (ref 4.22–5.81)
RDW: 15.9 % — ABNORMAL HIGH (ref 11.5–15.5)
WBC: 6.1 10*3/uL (ref 4.0–10.5)

## 2021-06-21 LAB — URIC ACID: Uric Acid, Serum: 7.5 mg/dL (ref 4.0–7.8)

## 2021-06-21 LAB — TESTOSTERONE: Testosterone: 290.86 ng/dL — ABNORMAL LOW (ref 300.00–890.00)

## 2021-06-21 LAB — VITAMIN D 25 HYDROXY (VIT D DEFICIENCY, FRACTURES): VITD: 37.6 ng/mL (ref 30.00–100.00)

## 2021-06-21 MED ORDER — VENLAFAXINE HCL ER 37.5 MG PO CP24
37.5000 mg | ORAL_CAPSULE | Freq: Every day | ORAL | 3 refills | Status: DC
  Filled 2021-06-21: qty 90, 90d supply, fill #0
  Filled 2021-10-08 – 2021-10-16 (×2): qty 90, 90d supply, fill #1
  Filled 2022-01-15: qty 90, 90d supply, fill #2
  Filled 2022-04-11: qty 90, 90d supply, fill #3

## 2021-06-21 MED ORDER — ALLOPURINOL 100 MG PO TABS
200.0000 mg | ORAL_TABLET | Freq: Every day | ORAL | 3 refills | Status: DC
  Filled 2021-06-21: qty 180, 90d supply, fill #0

## 2021-06-21 NOTE — Assessment & Plan Note (Signed)
Doing well on venlafaxine ER 37.5 mg, continue same.

## 2021-06-21 NOTE — Assessment & Plan Note (Signed)
Immunizations UTD. Colonoscopy UTD.  Discussed the importance of a healthy diet and regular exercise in order for weight loss, and to reduce the risk of further co-morbidity.  Exam today stable. Labs pending, including STD panel per patient request.

## 2021-06-21 NOTE — Assessment & Plan Note (Signed)
Repeat A1C pending.  Discussed the importance of a healthy diet and regular exercise in order for weight loss, and to reduce the risk of further co-morbidity.  

## 2021-06-21 NOTE — Assessment & Plan Note (Signed)
Level of 250 noted last year, will repeat again today.  Consider Urology referral.

## 2021-06-21 NOTE — Progress Notes (Signed)
   HPI: 45 y.o. male presenting today as a new patient for evaluation of pain and tenderness to the right great toe joint has been present for few years now.  Pain with activity.  Pain with certain shoes as well.  Patient states the pain is slowly increased over the past few years.  He has not done anything for treatment other than shoe gear modifications which did not alleviate any of his pain recently.  He presents for further treatment and evaluation  Past Medical History:  Diagnosis Date   Allergy    Depression    Gout    History of colonic polyps 07/17/2011   Hypertension    Sickle cell trait (HCC)    Sleep apnea    wears cpap      Physical Exam: General: The patient is alert and oriented x3 in no acute distress.  Dermatology: Skin is warm, dry and supple bilateral lower extremities. Negative for open lesions or macerations.  Vascular: Palpable pedal pulses bilaterally. No edema or erythema noted. Capillary refill within normal limits.  Neurological: Epicritic and protective threshold grossly intact bilaterally.   Musculoskeletal Exam: Range of motion within normal limits to all pedal and ankle joints bilateral. Muscle strength 5/5 in all groups bilateral.  Pain on palpation and range of motion to the first MTPJ with palpable dorsal medial spurring of the metatarsal head.  Associated tenderness to palpation  Radiographic Exam:  Normal osseous mineralization.  There are some very mild degenerative changes noted within the first MTPJ of the right foot.  Periarticular spurring noted to the metatarsal head best visualized on lateral and oblique views.  There is a very mild hallux valgus deformity however the majority of the patient's complaint is secondary to the spur formations  Assessment: 1.  Periarticular spurs/hallux limitus right great toe   Plan of Care:  1. Patient evaluated. X-Rays reviewed.  2. Today we discussed the conservative versus surgical management of the  presenting pathology. The patient opts for surgical management. All possible complications and details of the procedure were explained. All patient questions were answered. No guarantees were expressed or implied. 3. Authorization for surgery was initiated today. Surgery will consist of cheilectomy right great toe.  We did discuss other additional options surgically which would include a bunionectomy however I believe the majority the patient's symptoms is simply coming from the spurs.  I would like to keep the surgery as minimal as possible and only perform the cheilectomy/resection of the spurs.  Patient agrees. 4.  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  Pronounced [place]       Edrick Kins, DPM [facility]  Dr. Edrick Kins, DPM    [address], [place] [address]                Office [phone]  Fax [phone]

## 2021-06-21 NOTE — Assessment & Plan Note (Signed)
Well controlled in the office today on HCTZ 25 mg, amlodipine 10 mg, losartan 50 mg. Continue same.   CMP pending.

## 2021-06-21 NOTE — Progress Notes (Signed)
Subjective:    Patient ID: Joseph Ferguson, male    DOB: 07/29/1976, 45 y.o.   MRN: [id number]  HPI  Joseph Ferguson is a very pleasant 45 y.o. male who presents today for complete physical and follow up of chronic conditions. He would also like STD testing.   Immunizations: -Tetanus: 2007. Due today. -Influenza: Due this season  -Covid-19: Completed 3 vaccines   Diet: Fair diet. Is now working night shift, is eating less.  Exercise: 2-3 days weekly, active   Eye exam: Completes annually  Dental exam: Completes semi-annually   Colonoscopy: Completed in 2021, due 2026  BP Readings from Last 3 Encounters:  06/21/21 126/82  07/07/20 124/84  11/30/19 (!) 123/93    Wt Readings from Last 3 Encounters:  06/21/21 267 lb (121.1 kg)  08/04/20 254 lb (115.2 kg)  07/07/20 256 lb (116.1 kg)       Review of Systems  Constitutional:  Negative for unexpected weight change.  HENT:  Negative for rhinorrhea.   Respiratory:  Negative for cough and shortness of breath.   Cardiovascular:  Negative for chest pain.  Gastrointestinal:  Negative for constipation and diarrhea.  Genitourinary:  Negative for difficulty urinating.  Musculoskeletal:  Negative for arthralgias and myalgias.  Skin:  Negative for rash.  Allergic/Immunologic: Positive for environmental allergies.  Neurological:  Negative for dizziness, numbness and headaches.  Psychiatric/Behavioral:  The patient is not nervous/anxious.         Past Medical History:  Diagnosis Date   Allergy    Depression    Gout    Hypertension    Sickle cell trait (HCC)    Sleep apnea    wears cpap     Social History   Socioeconomic History   Marital status: Single    Spouse name: Not on file   Number of children: Not on file   Years of education: Not on file   Highest education level: Not on file  Occupational History   Not on file  Tobacco Use   Smoking status: Never   Smokeless tobacco: Never  Substance and Sexual  Activity   Alcohol use: Yes    Alcohol/week: 0.0 standard drinks    Comment: rarely   Drug use: No   Sexual activity: Yes    Birth control/protection: None  Other Topics Concern   Not on file  Social History Narrative   Divorced.   2 children.   CRNA, works for [employer].    Enjoys spending time with children, relaxing.    Social Determinants of Health   Financial Resource Strain: Not on file  Food Insecurity: Not on file  Transportation Needs: Not on file  Physical Activity: Not on file  Stress: Not on file  Social Connections: Not on file  Intimate Partner Violence: Not on file    Past Surgical History:  Procedure Laterality Date   COLONOSCOPY  2012   POLYPECTOMY     WISDOM TOOTH EXTRACTION      Family History  Problem Relation Age of Onset   Colon cancer Maternal Grandfather 58   Hypertension Father    Colon polyps Neg Hx    Esophageal cancer Neg Hx    Rectal cancer Neg Hx    Stomach cancer Neg Hx     Allergies  Allergen Reactions   Shellfish Allergy Swelling    Current Outpatient Medications on File Prior to Visit  Medication Sig Dispense Refill   allopurinol (ZYLOPRIM) 100 MG tablet TAKE 2 TABLETS (  200 MG TOTAL) BY MOUTH DAILY. FOR GOUT PREVENTION. 180 tablet 1   amLODipine (NORVASC) 10 MG tablet TAKE 1 TABLET BY MOUTH DAILY FOR BLOOD PRESSURE 90 tablet 2   hydrochlorothiazide (HYDRODIURIL) 25 MG tablet TAKE 1 TABLET (25 MG TOTAL) BY MOUTH DAILY FOR BLOOD PRESSURE. 90 tablet 3   losartan (COZAAR) 50 MG tablet Take 1 tablet (50 mg total) by mouth daily for blood pressure. 90 tablet 0   Multiple Vitamin (MULTIVITAMIN PO) Take by mouth daily. [employer] Men's multivitamin     venlafaxine XR (EFFEXOR-XR) 37.5 MG 24 hr capsule TAKE 1 CAPSULE BY MOUTH DAILY WITH BREAKFAST 90 capsule 3   No current facility-administered medications on file prior to visit.    BP 126/82   Pulse (!) 56   Temp 97.7 F (36.5 C) (Temporal)   Ht '5\' 10"'$  (1.778 m)   Wt 267 lb (121.1 kg)    SpO2 99%   BMI 38.31 kg/m  Objective:   Physical Exam HENT:     Right Ear: Tympanic membrane and ear canal normal.     Left Ear: Tympanic membrane and ear canal normal.     Nose: Nose normal.     Right Sinus: No maxillary sinus tenderness or frontal sinus tenderness.     Left Sinus: No maxillary sinus tenderness or frontal sinus tenderness.  Eyes:     Conjunctiva/sclera: Conjunctivae normal.  Neck:     Thyroid: No thyromegaly.     Vascular: No carotid bruit.  Cardiovascular:     Rate and Rhythm: Normal rate and regular rhythm.     Heart sounds: Normal heart sounds.  Pulmonary:     Effort: Pulmonary effort is normal.     Breath sounds: Normal breath sounds. No wheezing or rales.  Abdominal:     General: Bowel sounds are normal.     Palpations: Abdomen is soft.     Tenderness: There is no abdominal tenderness.  Musculoskeletal:        General: Normal range of motion.     Cervical back: Neck supple.  Skin:    General: Skin is warm and dry.  Neurological:     Mental Status: He is alert and oriented to person, place, and time.     Cranial Nerves: No cranial nerve deficit.     Deep Tendon Reflexes: Reflexes are normal and symmetric.  Psychiatric:        Mood and Affect: Mood normal.          Assessment & Plan:      This visit occurred during the SARS-CoV-2 public health emergency.  Safety protocols were in place, including screening questions prior to the visit, additional usage of staff PPE, and extensive cleaning of exam room while observing appropriate contact time as indicated for disinfecting solutions.

## 2021-06-21 NOTE — Assessment & Plan Note (Signed)
Discussed the importance of a healthy diet and regular exercise in order for weight loss, and to reduce the risk of further co-morbidity.  Repeat lipid panel pending. 

## 2021-06-21 NOTE — Assessment & Plan Note (Signed)
Colonoscopy UTD, due in 2026.

## 2021-06-21 NOTE — Addendum Note (Signed)
Addended by: Francella Solian on: 06/21/2021 10:03 AM   Modules accepted: Orders

## 2021-06-21 NOTE — Assessment & Plan Note (Signed)
Compliant to vitamin D3 3000 IU daily, repeat vitamin D level pending.

## 2021-06-21 NOTE — Patient Instructions (Addendum)
Stop by the lab prior to leaving today. I will notify you of your results once received.   It was a pleasure to see you today!  Preventive Care 32-45 Years Old, Male Preventive care refers to lifestyle choices and visits with your health care provider that can promote health and wellness. This includes: A yearly physical exam. This is also called an annual wellness visit. Regular dental and eye exams. Immunizations. Screening for certain conditions. Healthy lifestyle choices, such as: Eating a healthy diet. Getting regular exercise. Not using drugs or products that contain nicotine and tobacco. Limiting alcohol use. What can I expect for my preventive care visit? Physical exam Your health care provider will check your: Height and weight. These may be used to calculate your BMI (body mass index). BMI is a measurement that tells if you are at a healthy weight. Heart rate and blood pressure. Body temperature. Skin for abnormal spots. Counseling Your health care provider may ask you questions about your: Past medical problems. Family's medical history. Alcohol, tobacco, and drug use. Emotional well-being. Home life and relationship well-being. Sexual activity. Diet, exercise, and sleep habits. Work and work Statistician. Access to firearms. What immunizations do I need?  Vaccines are usually given at various ages, according to a schedule. Your health care provider will recommend vaccines for you based on your age, medicalhistory, and lifestyle or other factors, such as travel or where you work. What tests do I need? Blood tests Lipid and cholesterol levels. These may be checked every 5 years, or more often if you are over 95 years old. Hepatitis C test. Hepatitis B test. Screening Lung cancer screening. You may have this screening every year starting at age 23 if you have a 30-pack-year history of smoking and currently smoke or have quit within the past 15 years. Prostate cancer  screening. Recommendations will vary depending on your family history and other risks. Genital exam to check for testicular cancer or hernias. Colorectal cancer screening. All adults should have this screening starting at age 60 and continuing until age 21. Your health care provider may recommend screening at age 75 if you are at increased risk. You will have tests every 1-10 years, depending on your results and the type of screening test. Diabetes screening. This is done by checking your blood sugar (glucose) after you have not eaten for a while (fasting). You may have this done every 1-3 years. STD (sexually transmitted disease) testing, if you are at risk. Follow these instructions at home: Eating and drinking  Eat a diet that includes fresh fruits and vegetables, whole grains, lean protein, and low-fat dairy products. Take vitamin and mineral supplements as recommended by your health care provider. Do not drink alcohol if your health care provider tells you not to drink. If you drink alcohol: Limit how much you have to 0-2 drinks a day. Be aware of how much alcohol is in your drink. In the [place] one drink equals one 12 oz bottle of beer (355 mL), one 5 oz glass of wine (148 mL), or one 1 oz glass of hard liquor (44 mL).  Lifestyle Take daily care of your teeth and gums. Brush your teeth every morning and night with fluoride toothpaste. Floss one time each day. Stay active. Exercise for at least 30 minutes 5 or more days each week. Do not use any products that contain nicotine or tobacco, such as cigarettes, e-cigarettes, and chewing tobacco. If you need help quitting, ask your health care provider. Do  not use drugs. If you are sexually active, practice safe sex. Use a condom or other form of protection to prevent STIs (sexually transmitted infections). If told by your health care provider, take low-dose aspirin daily starting at age 18. Find healthy ways to cope with stress, such  as: Meditation, yoga, or listening to music. Journaling. Talking to a trusted person. Spending time with friends and family. Safety Always wear your seat belt while driving or riding in a vehicle. Do not drive: If you have been drinking alcohol. Do not ride with someone who has been drinking. When you are tired or distracted. While texting. Wear a helmet and other protective equipment during sports activities. If you have firearms in your house, make sure you follow all gun safety procedures. What's next? Go to your health care provider once a year for an annual wellness visit. Ask your health care provider how often you should have your eyes and teeth checked. Stay up to date on all vaccines. This information is not intended to replace advice given to you by your health care provider. Make sure you discuss any questions you have with your healthcare provider. Document Revised: 07/14/2019 Document Reviewed: 10/09/2018 Elsevier Patient Education  [employer].      Tdap (Tetanus, Diphtheria, Pertussis) Vaccine: What You Need to Know 1. Why get vaccinated? Tdap vaccine can prevent tetanus, diphtheria, and pertussis. Diphtheria and pertussis spread from person to person. Tetanus enters the body through cuts or wounds. TETANUS (T) causes painful stiffening of the muscles. Tetanus can lead to serious health problems, including being unable to open the mouth, having trouble swallowing and breathing, or death. DIPHTHERIA (D) can lead to difficulty breathing, heart failure, paralysis, or death. PERTUSSIS (aP), also known as "whooping cough," can cause uncontrollable, violent coughing that makes it hard to breathe, eat, or drink. Pertussis can be extremely serious especially in babies and young children, causing pneumonia, convulsions, brain damage, or death. In teens and adults, it can cause weight loss, loss of bladder control, passing out, and rib fractures from severe coughing. 2.  Tdap vaccine Tdap is only for children 7 years and older, adolescents, and adults.  Adolescents should receive a single dose of Tdap, preferably at age 76 or 41 years. Pregnant people should get a dose of Tdap during every pregnancy, preferably during the early part of the third trimester, to help protect the newborn from pertussis. Infants are most at risk for severe, life-threatening complications frompertussis. Adults who have never received Tdap should get a dose of Tdap. Also, adults should receive a booster dose of either Tdap or Td (a different vaccine that protects against tetanus and diphtheria but not pertussis) every 10 years, or after 5 years in the case of a severe or dirty wound or burn. Tdap may be given at the same time as other vaccines. 3. Talk with your health care provider Tell your vaccine provider if the person getting the vaccine: Has had an allergic reaction after a previous dose of any vaccine that protects against tetanus, diphtheria, or pertussis, or has any severe, life-threatening allergies Has had a coma, decreased level of consciousness, or prolonged seizures within 7 days after a previous dose of any pertussis vaccine (DTP, DTaP, or Tdap) Has seizures or another nervous system problem Has ever had Guillain-Barr Syndrome (also called "GBS") Has had severe pain or swelling after a previous dose of any vaccine that protects against tetanus or diphtheria In some cases, your health care provider may decide  to postpone Tdapvaccination until a future visit. People with minor illnesses, such as a cold, may be vaccinated. People who are moderately or severely ill should usually wait until they recover beforegetting Tdap vaccine.  Your health care provider can give you more information. 4. Risks of a vaccine reaction Pain, redness, or swelling where the shot was given, mild fever, headache, feeling tired, and nausea, vomiting, diarrhea, or stomachache sometimes happen after  Tdap vaccination. People sometimes faint after medical procedures, including vaccination. Tellyour provider if you feel dizzy or have vision changes or ringing in the ears.  As with any medicine, there is a very remote chance of a vaccine causing asevere allergic reaction, other serious injury, or death. 5. What if there is a serious problem? An allergic reaction could occur after the vaccinated person leaves the clinic. If you see signs of a severe allergic reaction (hives, swelling of the face and throat, difficulty breathing, a fast heartbeat, dizziness, or weakness), call 9-1-1and get the person to the nearest hospital. For other signs that concern you, call your health care provider.  Adverse reactions should be reported to the Vaccine Adverse Event Reporting System (VAERS). Your health care provider will usually file this report, or you can do it yourself. Visit the VAERS website at www.vaers.SamedayNews.es or call (539)670-9032. VAERS is only for reporting reactions, and VAERS staff members do not give medical advice. 6. The National Vaccine Injury Compensation Program The [employer] Vaccine Injury Compensation Program (VICP) is a federal program that was created to compensate people who may have been injured by certain vaccines. Claims regarding alleged injury or death due to vaccination have a time limit for filing, which may be as short as two years. Visit the VICP website at GoldCloset.com.ee or call 2036233272to learn about the program and about filing a claim. 7. How can I learn more? Ask your health care provider. Call your local or state health department. Visit the website of the Food and Drug Administration (FDA) for vaccine package inserts and additional information at TraderRating.uy. Contact the Centers for Disease Control and Prevention (CDC): Call 415-017-6564 (1-800-CDC-INFO) or Visit CDC's website at http://hunter.com/. Vaccine  Information Statement Tdap (Tetanus, Diphtheria, Pertussis) Vaccine(06/03/2020) This information is not intended to replace advice given to you by your health care provider. Make sure you discuss any questions you have with your healthcare provider. Document Revised: 06/29/2020 Document Reviewed: 06/29/2020 Elsevier Patient Education  2022 [employer].

## 2021-06-21 NOTE — Assessment & Plan Note (Signed)
No recent gout flares but can feel early symptoms of a flare at times. Compliant to 200 mg of allopurinol. Would prefer to try Uloric again, will check with pharmacy for coverage.  Did well on Uloric in the past.

## 2021-06-22 ENCOUNTER — Other Ambulatory Visit: Payer: Self-pay | Admitting: Podiatry

## 2021-06-22 DIAGNOSIS — M205X1 Other deformities of toe(s) (acquired), right foot: Secondary | ICD-10-CM

## 2021-06-27 LAB — HSV 1/2 AB (IGM), IFA W/RFLX TITER
HSV 1 IgM Screen: NEGATIVE
HSV 2 IgM Screen: NEGATIVE

## 2021-06-27 LAB — C. TRACHOMATIS/N. GONORRHOEAE RNA
C. trachomatis RNA, TMA: NOT DETECTED
N. gonorrhoeae RNA, TMA: NOT DETECTED

## 2021-06-27 LAB — HSV(HERPES SIMPLEX VRS) I + II AB-IGG
HAV 1 IGG,TYPE SPECIFIC AB: 3.83 index — ABNORMAL HIGH
HSV 2 IGG,TYPE SPECIFIC AB: >23 index — ABNORMAL HIGH

## 2021-06-27 LAB — HIV ANTIBODY (ROUTINE TESTING W REFLEX): HIV 1&2 Ab, 4th Generation: NONREACTIVE

## 2021-06-27 LAB — RPR: RPR Ser Ql: NONREACTIVE

## 2021-06-27 LAB — HEPATITIS C ANTIBODY
Hepatitis C Ab: NONREACTIVE
SIGNAL TO CUT-OFF: 0.07 (ref <1.00)

## 2021-06-27 LAB — TRICHOMONAS VAGINALIS RNA, QL,MALES: Trichomonas vaginalis RNA: NOT DETECTED

## 2021-06-28 DIAGNOSIS — M1A9XX0 Chronic gout, unspecified, without tophus (tophi): Secondary | ICD-10-CM

## 2021-06-29 ENCOUNTER — Other Ambulatory Visit (HOSPITAL_COMMUNITY): Payer: Self-pay

## 2021-06-29 MED ORDER — ALLOPURINOL 300 MG PO TABS
300.0000 mg | ORAL_TABLET | Freq: Every day | ORAL | 3 refills | Status: DC
  Filled 2021-06-29 – 2021-07-14 (×2): qty 90, 90d supply, fill #0
  Filled 2021-10-08 – 2021-10-25 (×2): qty 30, 30d supply, fill #1
  Filled 2022-01-15: qty 90, 90d supply, fill #1
  Filled 2022-04-11: qty 90, 90d supply, fill #2

## 2021-07-07 ENCOUNTER — Other Ambulatory Visit (HOSPITAL_COMMUNITY): Payer: Self-pay

## 2021-07-13 ENCOUNTER — Telehealth: Payer: Self-pay

## 2021-07-13 NOTE — Telephone Encounter (Signed)
I spoke with pt; pt has a video visit already scheduled with Romilda Garret NP on 07/14/21 at 10 AM. Pt said he only has a S/T now that is painful to swallow but pt is not having any swelling in throat, neck, mouth or tongue and no difficulty breathing. UC & ED precautions given and pt voiced understanding. Sending note to Romilda Garret NP and Anastasiya CMA.

## 2021-07-13 NOTE — Telephone Encounter (Signed)
[facility] - Client TELEPHONE ADVICE RECORD [facility] Patient Name: Joseph Ferguson Gender: Male DOB: 02-13-1976 Age: 45 Y 37 M 10 D Return Phone Number: [phone] (Primary) Address: City/ State/ Zip: [place] [address] Client [facility] - Client Client Site [facility] - Day Physician Alma Friendly - NP Contact Type Call Who Is Calling Patient / Member / Family / Caregiver Call Type Triage / Clinical Relationship To Patient Self Return Phone Number [phone] (Primary) Chief Complaint Sore Throat Reason for Call Symptomatic / Request for [place] is from the office. They state they have a really bad sore throat. It has been happening since Friday, he states he had flu like symptoms and is COVID negative 3 separate times. He had fever, chills, and body aches at that time and it has since subsided but the sore throat is lingering. Translation No Nurse Assessment Nurse: [place], RN, Estill Bamberg Date/Time (Eastern Time): 07/13/2021 3:55:48 PM Confirm and document reason for call. If symptomatic, describe symptoms. ---caller states he has a sore throat, mild cough Does the patient have any new or worsening symptoms? ---Yes Will a triage be completed? ---Yes Related visit to physician within the last 2 weeks? ---No Does the PT have any chronic conditions? (i.e. diabetes, asthma, this includes High risk factors for pregnancy, etc.) ---Yes List chronic conditions. ---HTN, sleep apnea Is this a behavioral health or substance abuse call? ---No Guidelines Guideline Title Affirmed Question Affirmed Notes Nurse Date/Time Eilene Ghazi Time) Sore Throat SEVERE (e.g., excruciating) throat pain [place], RN, Estill Bamberg 07/13/2021 3:56:54 PM Disp. Time Eilene Ghazi Time) Disposition Final User 07/13/2021 3:59:35 PM See PCP within 24 Hours Yes [place], RN,  Estill Bamberg PLEASE NOTE: All timestamps contained within this report are represented as [place] Standard Time. CONFIDENTIALTY NOTICE: This fax transmission is intended only for the addressee. It contains information that is legally privileged, confidential or otherwise protected from use or disclosure. If you are not the intended recipient, you are strictly prohibited from reviewing, disclosing, copying using or disseminating any of this information or taking any action in reliance on or regarding this information. If you have received this fax in error, please notify us immediately by telephone so that we can arrange for its return to [place]. Phone: [phone], Toll-Free: [phone], Fax: [phone] Page: 2 of 2 Call Id: [id number] [place] Disagree/Comply Comply Caller Understands Yes PreDisposition Call Doctor Care Advice Given Per Guideline SEE PCP WITHIN 24 HOURS: * IF OFFICE WILL BE OPEN: You need to be examined within the next 24 hours. Call your doctor (or NP/PA) when the office opens and make an appointment. SORE THROAT: * Here are some simple things you can do to treat and reduce sore throat pain. * Sip warm chicken broth or apple juice. * Gargle with warm salt water four times a day. To make salt water, put 1/2 teaspoon of salt in 8 oz (240 ml) of warm water. CARE ADVICE given per Sore Throat (Adult) guideline. * You become worse CALL BACK IF: Referrals REFERRED TO PCP OFFICE

## 2021-07-13 NOTE — Telephone Encounter (Signed)
Noted will see at scheduled time

## 2021-07-14 ENCOUNTER — Other Ambulatory Visit: Payer: 59

## 2021-07-14 ENCOUNTER — Other Ambulatory Visit (HOSPITAL_COMMUNITY): Payer: Self-pay

## 2021-07-14 ENCOUNTER — Telehealth (INDEPENDENT_AMBULATORY_CARE_PROVIDER_SITE_OTHER): Payer: 59 | Admitting: Nurse Practitioner

## 2021-07-14 ENCOUNTER — Other Ambulatory Visit: Payer: Self-pay | Admitting: Primary Care

## 2021-07-14 DIAGNOSIS — J029 Acute pharyngitis, unspecified: Secondary | ICD-10-CM | POA: Insufficient documentation

## 2021-07-14 DIAGNOSIS — J020 Streptococcal pharyngitis: Secondary | ICD-10-CM | POA: Diagnosis not present

## 2021-07-14 DIAGNOSIS — I10 Essential (primary) hypertension: Secondary | ICD-10-CM

## 2021-07-14 LAB — POCT RAPID STREP A (OFFICE): Rapid Strep A Screen: POSITIVE — AB

## 2021-07-14 MED ORDER — LOSARTAN POTASSIUM 50 MG PO TABS
50.0000 mg | ORAL_TABLET | Freq: Every day | ORAL | 3 refills | Status: DC
  Filled 2021-07-14: qty 90, 90d supply, fill #0
  Filled 2021-10-08 – 2021-10-16 (×2): qty 90, 90d supply, fill #1
  Filled 2022-01-15: qty 90, 90d supply, fill #2
  Filled 2022-04-11: qty 90, 90d supply, fill #3

## 2021-07-14 MED ORDER — AMOXICILLIN 500 MG PO CAPS
500.0000 mg | ORAL_CAPSULE | Freq: Three times a day (TID) | ORAL | 0 refills | Status: AC
Start: 2021-07-14 — End: 2021-07-24
  Filled 2021-07-14: qty 30, 10d supply, fill #0

## 2021-07-14 NOTE — Progress Notes (Signed)
Patient ID: Joseph Ferguson, male    DOB: 04-03-1976, 45 y.o.   MRN: [id number]  Virtual visit completed through [employer], a video enabled telemedicine application. Due to national recommendations of social distancing due to COVID-19, a virtual visit is felt to be most appropriate for this patient at this time. Reviewed limitations, risks, security and privacy concerns of performing a virtual visit and the availability of in person appointments. I also reviewed that there may be a patient responsible charge related to this service. The patient agreed to proceed.   Patient location: home Provider location: [place] at [facility], office Persons participating in this virtual visit: patient, provider   If any vitals were documented, they were collected by patient at home unless specified below.    There were no vitals taken for this visit.   CC: Sore Throat Subjective:   HPI: Joseph Ferguson is a 45 y.o. male presenting on 07/14/2021 for Sore Throat (Started about 1 week ago. Staying the same. Sore sensation and itchy/scratchy at the same time. Sore feeling with solids and liquids. A week ago had fever, chills, body aches, cough but that resolved after 2 to 3 days. Covid test was negative on 07/07/21.)    Approx a week ago he started experiencing Fever chills HA, scracthy, sore thorat.  At home test and PCR was negative. States that the sore throat was better came back  Have tried otc lozenges and takes allegra daily  Relevant past medical, surgical, family and social history reviewed and updated as indicated. Interim medical history since our last visit reviewed. Allergies and medications reviewed and updated. Outpatient Medications Prior to Visit  Medication Sig Dispense Refill   allopurinol (ZYLOPRIM) 300 MG tablet Take 1 tablet (300 mg total) by mouth daily for gout prevention. 90 tablet 3   amLODipine (NORVASC) 10 MG tablet TAKE 1 TABLET BY MOUTH DAILY FOR BLOOD PRESSURE 90  tablet 2   hydrochlorothiazide (HYDRODIURIL) 25 MG tablet TAKE 1 TABLET (25 MG TOTAL) BY MOUTH DAILY FOR BLOOD PRESSURE. 90 tablet 3   losartan (COZAAR) 50 MG tablet Take 1 tablet (50 mg total) by mouth daily for blood pressure. 90 tablet 0   Multiple Vitamin (MULTIVITAMIN PO) Take by mouth daily. [employer] Men's multivitamin     venlafaxine XR (EFFEXOR-XR) 37.5 MG 24 hr capsule Take 1 capsule (37.5 mg total) by mouth daily with breakfast for depression. 90 capsule 3   No facility-administered medications prior to visit.     Per HPI unless specifically indicated in ROS section below Review of Systems  HENT:  Positive for sore throat.   Respiratory:  Positive for cough.   Neurological:  Negative for headaches.  Objective:  There were no vitals taken for this visit.  Wt Readings from Last 3 Encounters:  06/21/21 267 lb (121.1 kg)  08/04/20 254 lb (115.2 kg)  07/07/20 256 lb (116.1 kg)       Physical exam: Gen: alert, NAD, not ill appearing Pulm: speaks in complete sentences without increased work of breathing Psych: normal mood, normal thought content      Results for orders placed or performed in visit on 06/21/21  C. trachomatis/N. gonorrhoeae RNA   Specimen: Blood  Result Value Ref Range   C. trachomatis RNA, TMA NOT DETECTED NOT DETECTED   N. gonorrhoeae RNA, TMA NOT DETECTED NOT DETECTED  Lipid panel  Result Value Ref Range   Cholesterol 158 0 - 200 mg/dL   Triglycerides 83.0 0.0 - 149.0  mg/dL   HDL 35.40 (L) >39.00 mg/dL   VLDL 16.6 0.0 - 40.0 mg/dL   LDL Cholesterol 106 (H) 0 - 99 mg/dL   Total CHOL/HDL Ratio 4    NonHDL 122.42   Hemoglobin A1c  Result Value Ref Range   Hgb A1c MFr Bld 6.1 4.6 - 6.5 %  Comprehensive metabolic panel  Result Value Ref Range   Sodium 142 135 - 145 mEq/L   Potassium 3.8 3.5 - 5.1 mEq/L   Chloride 104 96 - 112 mEq/L   CO2 30 19 - 32 mEq/L   Glucose, Bld 92 70 - 99 mg/dL   BUN 14 6 - 23 mg/dL   Creatinine, Ser 1.26 0.40 - 1.50 mg/dL    Total Bilirubin 0.5 0.2 - 1.2 mg/dL   Alkaline Phosphatase 64 39 - 117 U/L   AST 64 (H) 0 - 37 U/L   ALT 43 0 - 53 U/L   Total Protein 7.1 6.0 - 8.3 g/dL   Albumin 4.0 3.5 - 5.2 g/dL   GFR 68.86 >60.00 mL/min   Calcium 9.5 8.4 - 10.5 mg/dL  CBC  Result Value Ref Range   WBC 6.1 4.0 - 10.5 K/uL   RBC 5.00 4.22 - 5.81 Mil/uL   Platelets 194.0 150.0 - 400.0 K/uL   Hemoglobin 13.6 13.0 - 17.0 g/dL   HCT 41.5 39.0 - 52.0 %   MCV 83.0 78.0 - 100.0 fl   MCHC 32.9 30.0 - 36.0 g/dL   RDW 15.9 (H) 11.5 - 15.5 %  Uric acid  Result Value Ref Range   Uric Acid, Serum 7.5 4.0 - 7.8 mg/dL  VITAMIN D 25 Hydroxy (Vit-D Deficiency, Fractures)  Result Value Ref Range   VITD 37.60 30.00 - 100.00 ng/mL  Testosterone  Result Value Ref Range   Testosterone 290.86 (L) 300.00 - 890.00 ng/dL  HIV Antibody (routine testing w rflx)  Result Value Ref Range   HIV 1&2 Ab, 4th Generation NON-REACTIVE NON-REACTIVE  Hepatitis C antibody  Result Value Ref Range   Hepatitis C Ab NON-REACTIVE NON-REACTIVE   SIGNAL TO CUT-OFF 0.07 <1.00  RPR  Result Value Ref Range   RPR Ser Ql NON-REACTIVE NON-REACTIVE  HSV 1/2 Ab (IgM), IFA w/rflx Titer  Result Value Ref Range   HSV 1 IgM Screen Negative Negative   HSV 2 IgM Screen Negative Negative  HSV(herpes simplex vrs) 1+2 ab-IgG  Result Value Ref Range   HAV 1 IGG,TYPE SPECIFIC AB 3.83 (H) index   HSV 2 IGG,TYPE SPECIFIC AB >23.00 (H) index  Trichomonas vaginalis RNA, Ql,Males  Result Value Ref Range   Trichomonas vaginalis RNA NOT DETECTED NOT DETECTED   Assessment & Plan:   Problem List Items Addressed This Visit       Respiratory   Strep pharyngitis   Relevant Medications   amoxicillin (AMOXIL) 500 MG capsule     Other   Sore throat - Primary    Patient has sore throat.  Pending COVID testing was negative.  Does work in [employer].  States he does have a history of getting strep as an adult.  Last time was several years ago.  Patient able to come by  and get strep tested today in office.  Testing did come back positive.  We will go ahead and treat for strep pharyngitis amoxicillin 500 3 times daily for 10 days.  Patient was called and alerted results and recommended treatment. Start amoxicillin 500 mg 3 times daily for 10 days.  Relevant Orders   Rapid Strep A (Completed)     No orders of the defined types were placed in this encounter.  No orders of the defined types were placed in this encounter.   I discussed the assessment and treatment plan with the patient. The patient was provided an opportunity to ask questions and all were answered. The patient agreed with the plan and demonstrated an understanding of the instructions. The patient was advised to call back or seek an in-person evaluation if the symptoms worsen or if the condition fails to improve as anticipated.  Follow up plan: No follow-ups on file.  Romilda Garret, NP

## 2021-07-14 NOTE — Assessment & Plan Note (Signed)
Patient has sore throat.  Pending COVID testing was negative.  Does work in [employer].  States he does have a history of getting strep as an adult.  Last time was several years ago.  Patient able to come by and get strep tested today in office.  Testing did come back positive.  We will go ahead and treat for strep pharyngitis amoxicillin 500 3 times daily for 10 days.  Patient was called and alerted results and recommended treatment. Start amoxicillin 500 mg 3 times daily for 10 days.

## 2021-07-18 ENCOUNTER — Other Ambulatory Visit (HOSPITAL_COMMUNITY): Payer: Self-pay

## 2021-07-18 MED FILL — Amlodipine Besylate Tab 10 MG (Base Equivalent): ORAL | 90 days supply | Qty: 90 | Fill #1 | Status: AC

## 2021-07-26 NOTE — Progress Notes (Signed)
[facility] [place] [address] [place] Phone: [phone] Subjective:   [name], am serving as a scribe for Dr. Hulan Saas. This visit occurred during the SARS-CoV-2 public health emergency.  Safety protocols were in place, including screening questions prior to the visit, additional usage of staff PPE, and extensive cleaning of exam room while observing appropriate contact time as indicated for disinfecting solutions.   I'm seeing this patient by the request  of:  Pleas Koch, NP  CC: Right leg pain  [id number]  Linwood T Liles is a 45 y.o. male coming in with complaint of R hamstring pain. Patient states went to [place] and was hiking and doing a lot of walking. Flew back [place] and 3 days later had a pain that runs from ischial tuberosity to back of the knee. Sitting is painful. Walking other than in a straight line is painful. Sharp pain with certain motions, but consistently achy. Ice, heat, and ibuprofen help momentarily.  Patient does not know if it is making any significant improvement.  The patient states that especially after sitting for some time has significant tightness noted.  Has not been able to increase activity very much at this moment.      Past Medical History:  Diagnosis Date   Allergy    Depression    Gout    History of colonic polyps 07/17/2011   Hypertension    Sickle cell trait (HCC)    Sleep apnea    wears cpap    Past Surgical History:  Procedure Laterality Date   COLONOSCOPY  2012   POLYPECTOMY     WISDOM TOOTH EXTRACTION     Social History   Socioeconomic History   Marital status: Single    Spouse name: Not on file   Number of children: Not on file   Years of education: Not on file   Highest education level: Not on file  Occupational History   Not on file  Tobacco Use   Smoking status: Never   Smokeless tobacco: Never  Substance and Sexual Activity   Alcohol use: Yes     Alcohol/week: 0.0 standard drinks    Comment: rarely   Drug use: No   Sexual activity: Yes    Birth control/protection: None  Other Topics Concern   Not on file  Social History Narrative   Divorced.   2 children.   CRNA, works for [employer].    Enjoys spending time with children, relaxing.    Social Determinants of Health   Financial Resource Strain: Not on file  Food Insecurity: Not on file  Transportation Needs: Not on file  Physical Activity: Not on file  Stress: Not on file  Social Connections: Not on file   Allergies  Allergen Reactions   Shellfish Allergy Swelling   Family History  Problem Relation Age of Onset   Colon cancer Maternal Grandfather 51   Hypertension Father    Colon polyps Neg Hx    Esophageal cancer Neg Hx    Rectal cancer Neg Hx    Stomach cancer Neg Hx      Current Outpatient Medications (Cardiovascular):    amLODipine (NORVASC) 10 MG tablet, TAKE 1 TABLET BY MOUTH DAILY FOR BLOOD PRESSURE   hydrochlorothiazide (HYDRODIURIL) 25 MG tablet, TAKE 1 TABLET (25 MG TOTAL) BY MOUTH DAILY FOR BLOOD PRESSURE.   losartan (COZAAR) 50 MG tablet, Take 1 tablet (50 mg total) by mouth daily for blood pressure.  Current Outpatient Medications (Analgesics):    meloxicam (MOBIC) 15 MG tablet, Take 1 tablet (15 mg total) by mouth daily.   allopurinol (ZYLOPRIM) 300 MG tablet, Take 1 tablet (300 mg total) by mouth daily for gout prevention.   Current Outpatient Medications (Other):    tiZANidine (ZANAFLEX) 4 MG tablet, Take 1 tablet (4 mg total) by mouth at bedtime.   Multiple Vitamin (MULTIVITAMIN PO), Take by mouth daily. [employer] Men's multivitamin   venlafaxine XR (EFFEXOR-XR) 37.5 MG 24 hr capsule, Take 1 capsule (37.5 mg total) by mouth daily with breakfast for depression.   Reviewed prior external information including notes and imaging from  primary care provider As well as notes that were available from care everywhere and other healthcare systems.  Past  medical history, social, surgical and family history all reviewed in electronic medical record.  No pertanent information unless stated regarding to the chief complaint.   Review of Systems:  No headache, visual changes, nausea, vomiting, diarrhea, constipation, dizziness, abdominal pain, skin rash, fevers, chills, night sweats, weight loss, swollen lymph nodes, body aches, joint swelling, chest pain, shortness of breath, mood changes. POSITIVE muscle aches  Objective  Blood pressure 126/84, pulse 72, height 5\' 10"  (1.778 m), weight 267 lb (121.1 kg), SpO2 97 %.   General: No apparent distress alert and oriented x3 mood and affect normal, dressed appropriately.  HEENT: Pupils equal, extraocular movements intact  Respiratory: Patient's speak in full sentences and does not appear short of breath  Cardiovascular: No lower extremity edema, non tender, no erythema  Gait severely antalgic Patient does have significant tightness noted of the hamstring on the right side but no significant weakness.  Seems to be in the muscle belly.  No significant pain over the ischial area itself.  No significant back pain.  Pain is not consistent.  No significant swelling of the lower extremity.  Limited muscular skeletal ultrasound was performed and interpreted by Hulan Saas, M  Limited ultrasound of patient's hamstring shows the patient does have some mild hypoechoic changes within the muscle belly of the hamstring but no true tear appreciated.  Mild increase in Doppler flow in this area that is consistent with a potential strain. Impression: Hamstring strain   Impression and Recommendations:     The above documentation has been reviewed and is accurate and complete Lyndal Pulley, DO

## 2021-07-27 ENCOUNTER — Encounter: Payer: Self-pay | Admitting: Family Medicine

## 2021-07-27 ENCOUNTER — Ambulatory Visit: Payer: Self-pay

## 2021-07-27 ENCOUNTER — Ambulatory Visit (INDEPENDENT_AMBULATORY_CARE_PROVIDER_SITE_OTHER): Payer: 59 | Admitting: Family Medicine

## 2021-07-27 ENCOUNTER — Other Ambulatory Visit: Payer: Self-pay

## 2021-07-27 ENCOUNTER — Other Ambulatory Visit (HOSPITAL_COMMUNITY): Payer: Self-pay

## 2021-07-27 VITALS — BP 126/84 | HR 72 | Ht 70.0 in | Wt 267.0 lb

## 2021-07-27 DIAGNOSIS — M2550 Pain in unspecified joint: Secondary | ICD-10-CM

## 2021-07-27 DIAGNOSIS — M79604 Pain in right leg: Secondary | ICD-10-CM | POA: Diagnosis not present

## 2021-07-27 DIAGNOSIS — S76311A Strain of muscle, fascia and tendon of the posterior muscle group at thigh level, right thigh, initial encounter: Secondary | ICD-10-CM

## 2021-07-27 HISTORY — DX: Strain of muscle, fascia and tendon of the posterior muscle group at thigh level, right thigh, initial encounter: S76.311A

## 2021-07-27 LAB — SEDIMENTATION RATE: Sed Rate: 44 mm/hr — ABNORMAL HIGH (ref 0–15)

## 2021-07-27 LAB — D-DIMER, QUANTITATIVE: D-Dimer, Quant: 0.4 mcg/mL FEU (ref <0.50)

## 2021-07-27 MED ORDER — MELOXICAM 15 MG PO TABS
15.0000 mg | ORAL_TABLET | Freq: Every day | ORAL | 0 refills | Status: DC
  Filled 2021-07-27: qty 30, 30d supply, fill #0

## 2021-07-27 MED ORDER — TIZANIDINE HCL 4 MG PO TABS
4.0000 mg | ORAL_TABLET | Freq: Every day | ORAL | 0 refills | Status: DC
  Filled 2021-07-27: qty 30, 30d supply, fill #0

## 2021-07-27 NOTE — Assessment & Plan Note (Signed)
Patient has had a strain noted on ultrasound.  No true tearing appreciated.  Patient will do warm compression sleeve, improvement, icing regimen and home exercises.  We will get D-dimer to rule out secondary to patient having some travel recently.  Follow-up with me again in 4 weeks otherwise.

## 2021-07-27 NOTE — Patient Instructions (Addendum)
Thigh compression with activity Do prescribed exercises at least 3x a week Meloxicam 15 mg Zanaflex 4mg  at bedtime See you again in 4 weeks

## 2021-08-16 ENCOUNTER — Other Ambulatory Visit (HOSPITAL_COMMUNITY): Payer: Self-pay

## 2021-08-16 MED FILL — Hydrochlorothiazide Tab 25 MG: ORAL | 90 days supply | Qty: 90 | Fill #1 | Status: AC

## 2021-08-21 NOTE — Progress Notes (Signed)
[name] [address] [place] Phone: [phone] Subjective:   [name], am serving as a scribe for Dr. Hulan Saas. This visit occurred during the SARS-CoV-2 public health emergency.  Safety protocols were in place, including screening questions prior to the visit, additional usage of staff PPE, and extensive cleaning of exam room while observing appropriate contact time as indicated for disinfecting solutions.   I'm seeing this patient by the request  of:  Pleas Koch, NP  CC: Right leg pain follow-up  [id number]  07/27/2021 Patient has had a strain noted on ultrasound.  No true tearing appreciated.  Patient will do warm compression sleeve, improvement, icing regimen and home exercises.  We will get D-dimer to rule out secondary to patient having some travel recently.  Follow-up with me again in 4 weeks otherwise.  Update 08/22/2021 Joseph Ferguson is a 45 y.o. male coming in with complaint of R leg pain. Patient states pain hasn't gotten much better. Has rested as much as possible. Can elicit pain when going into back extension. Feels a palpable knot on the right hamstring. Everyday is a constant 3 on the pain scale and sometimes it shoots up to a 9. Patient states that it can be for severe.  Feels like the extension of the back has started giving him more discomfort as well.      Past Medical History:  Diagnosis Date   Allergy    Depression    Gout    History of colonic polyps 07/17/2011   Hypertension    Sickle cell trait (HCC)    Sleep apnea    wears cpap    Past Surgical History:  Procedure Laterality Date   COLONOSCOPY  2012   POLYPECTOMY     WISDOM TOOTH EXTRACTION     Social History   Socioeconomic History   Marital status: Single    Spouse name: Not on file   Number of children: Not on file   Years of education: Not on file   Highest education level: Not on file  Occupational History    Not on file  Tobacco Use   Smoking status: Never   Smokeless tobacco: Never  Substance and Sexual Activity   Alcohol use: Yes    Alcohol/week: 0.0 standard drinks    Comment: rarely   Drug use: No   Sexual activity: Yes    Birth control/protection: None  Other Topics Concern   Not on file  Social History Narrative   Divorced.   2 children.   CRNA, works for [employer].    Enjoys spending time with children, relaxing.    Social Determinants of Health   Financial Resource Strain: Not on file  Food Insecurity: Not on file  Transportation Needs: Not on file  Physical Activity: Not on file  Stress: Not on file  Social Connections: Not on file   Allergies  Allergen Reactions   Shellfish Allergy Swelling   Family History  Problem Relation Age of Onset   Colon cancer Maternal Grandfather 7   Hypertension Father    Colon polyps Neg Hx    Esophageal cancer Neg Hx    Rectal cancer Neg Hx    Stomach cancer Neg Hx     Current Outpatient Medications (Endocrine & Metabolic):    predniSONE (DELTASONE) 50 MG tablet, Take 1 tablet (50 mg total) by mouth daily for 5 days.  Current Outpatient Medications (Cardiovascular):    amLODipine (NORVASC) 10  MG tablet, TAKE 1 TABLET BY MOUTH DAILY FOR BLOOD PRESSURE   hydrochlorothiazide (HYDRODIURIL) 25 MG tablet, Take 1 tablet (25 mg total) by mouth daily for blood pressure.   losartan (COZAAR) 50 MG tablet, Take 1 tablet (50 mg total) by mouth daily for blood pressure.   Current Outpatient Medications (Analgesics):    allopurinol (ZYLOPRIM) 300 MG tablet, Take 1 tablet (300 mg total) by mouth daily for gout prevention.   meloxicam (MOBIC) 15 MG tablet, Take 1 tablet (15 mg total) by mouth daily.   Current Outpatient Medications (Other):    gabapentin (NEURONTIN) 100 MG capsule, Take 2 capsules (200 mg total) by mouth at bedtime.   Multiple Vitamin (MULTIVITAMIN PO), Take by mouth daily. [employer] Men's multivitamin   tiZANidine (ZANAFLEX) 4 MG  tablet, Take 1 tablet (4 mg total) by mouth at bedtime.   venlafaxine XR (EFFEXOR-XR) 37.5 MG 24 hr capsule, Take 1 capsule (37.5 mg total) by mouth daily with breakfast for depression.   Reviewed prior external information including notes and imaging from  primary care provider As well as notes that were available from care everywhere and other healthcare systems.  Past medical history, social, surgical and family history all reviewed in electronic medical record.  No pertanent information unless stated regarding to the chief complaint.   Review of Systems:  No headache, visual changes, nausea, vomiting, diarrhea, constipation, dizziness, abdominal pain, skin rash, fevers, chills, night sweats, weight loss, swollen lymph nodes, body aches, joint swelling, chest pain, shortness of breath, mood changes. POSITIVE muscle aches  Objective  Blood pressure 120/78, pulse 61, height 5\' 10"  (1.778 m), weight 270 lb (122.5 kg), SpO2 98 %.   General: No apparent distress alert and oriented x3 mood and affect normal, dressed appropriately.  HEENT: Pupils equal, extraocular movements intact  Respiratory: Patient's speak in full sentences and does not appear short of breath  Cardiovascular: No lower extremity edema, non tender, no erythema  Gait severely antalgic  Patient has worsening pain of the back with any type of extension greater than 5 degrees.  Mild positive straight leg test on the right side.  Tightness noted of the right hamstring as well.  Patient still has pain in the muscle belly of the hamstring but no defect noted.  Good strength noted.  Patient does have tenderness to palpation in the L4, L5 and S1 distributions on the right side of the paraspinal musculature.   Limited muscular skeletal ultrasound was performed and interpreted by Hulan Saas, M  Limited ultrasound of patient's right hamstring does not show any significant defect noted at this time.  No increase in hypoechoic changes.   Where patient has the most tenderness seen no blood vessels noted. Impression: Resolving hamstring injury    Impression and Recommendations:     The above documentation has been reviewed and is accurate and complete Lyndal Pulley, DO

## 2021-08-22 ENCOUNTER — Ambulatory Visit (INDEPENDENT_AMBULATORY_CARE_PROVIDER_SITE_OTHER): Payer: 59 | Admitting: Family Medicine

## 2021-08-22 ENCOUNTER — Other Ambulatory Visit (HOSPITAL_COMMUNITY): Payer: Self-pay

## 2021-08-22 ENCOUNTER — Encounter: Payer: Self-pay | Admitting: Family Medicine

## 2021-08-22 ENCOUNTER — Other Ambulatory Visit: Payer: Self-pay | Admitting: Family Medicine

## 2021-08-22 ENCOUNTER — Ambulatory Visit: Payer: Self-pay

## 2021-08-22 ENCOUNTER — Other Ambulatory Visit: Payer: Self-pay

## 2021-08-22 ENCOUNTER — Ambulatory Visit (INDEPENDENT_AMBULATORY_CARE_PROVIDER_SITE_OTHER): Payer: 59

## 2021-08-22 VITALS — BP 120/78 | HR 61 | Ht 70.0 in | Wt 270.0 lb

## 2021-08-22 DIAGNOSIS — R102 Pelvic and perineal pain: Secondary | ICD-10-CM | POA: Diagnosis not present

## 2021-08-22 DIAGNOSIS — S76311A Strain of muscle, fascia and tendon of the posterior muscle group at thigh level, right thigh, initial encounter: Secondary | ICD-10-CM

## 2021-08-22 DIAGNOSIS — M545 Low back pain, unspecified: Secondary | ICD-10-CM | POA: Diagnosis not present

## 2021-08-22 DIAGNOSIS — M1A9XX0 Chronic gout, unspecified, without tophus (tophi): Secondary | ICD-10-CM

## 2021-08-22 DIAGNOSIS — M5416 Radiculopathy, lumbar region: Secondary | ICD-10-CM | POA: Insufficient documentation

## 2021-08-22 IMAGING — DX DG LUMBAR SPINE 2-3V
3 series · 3 of 3 positions shown · non-contrast
Comparison: CT [DATE].

CLINICAL DATA: Back pain.  Pelvic pain.

EXAM:
LUMBAR SPINE - 2-3 VIEW

[l-spine ap]
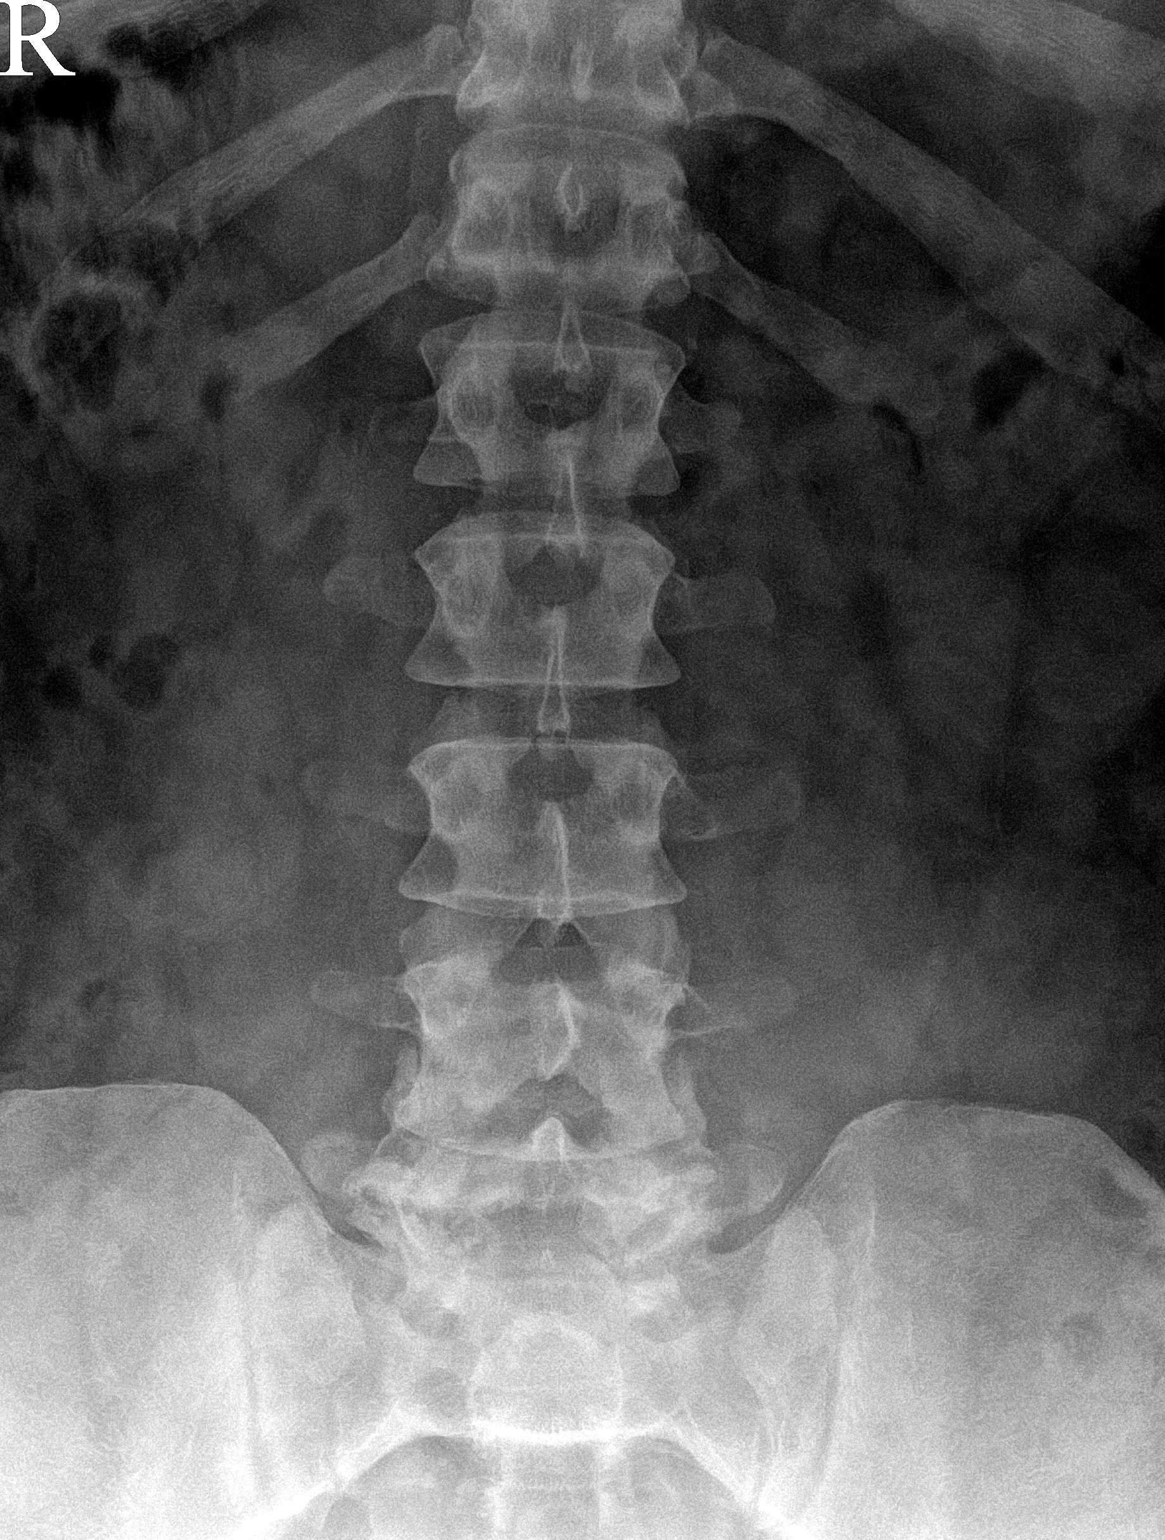

[l-spine lateral]
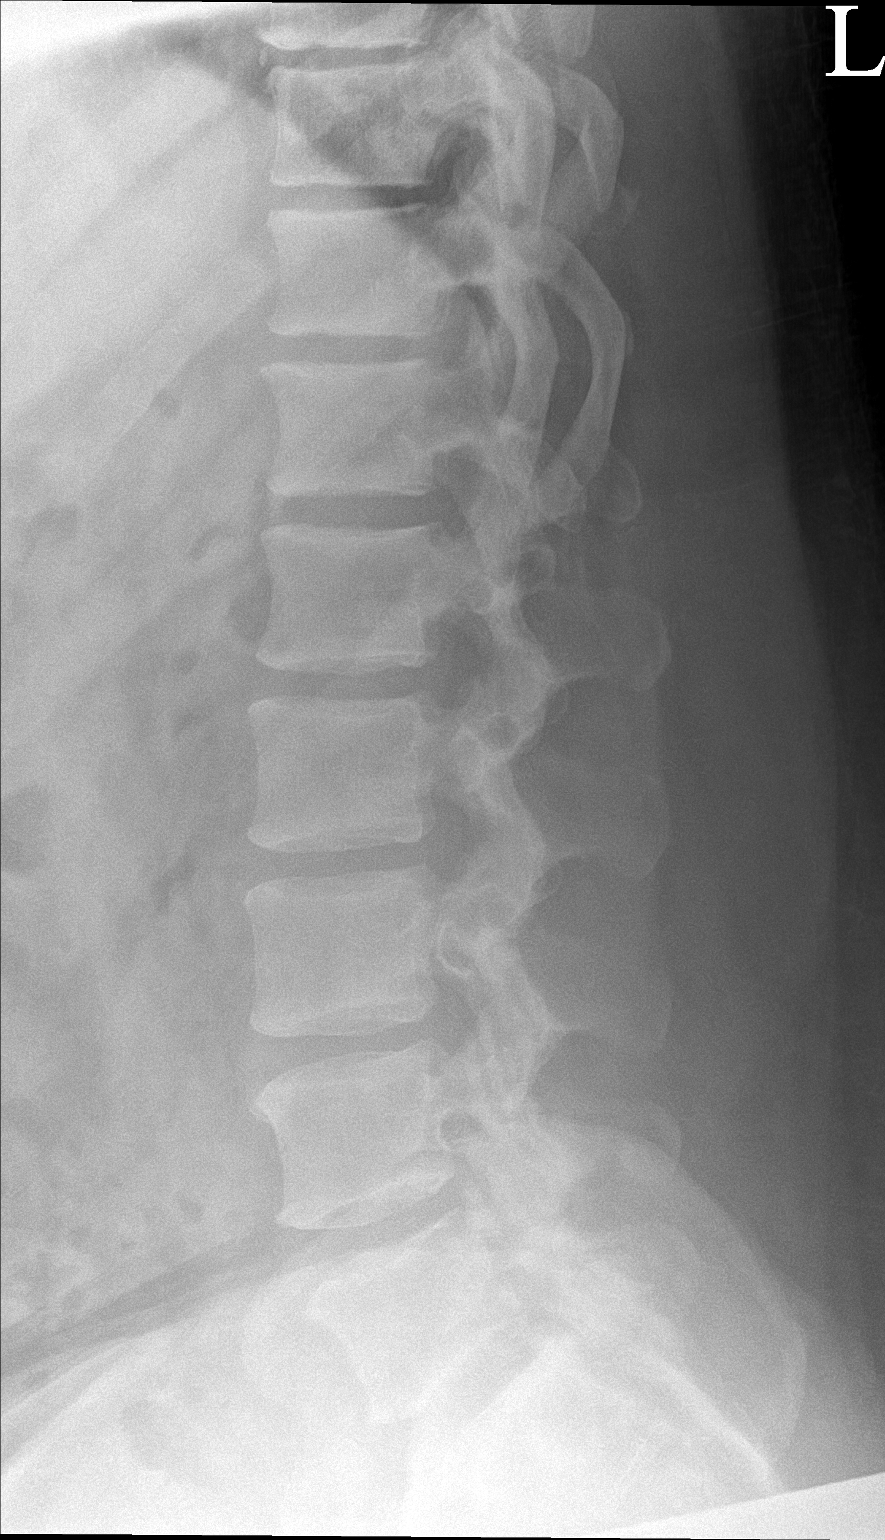

[l-spine spot]
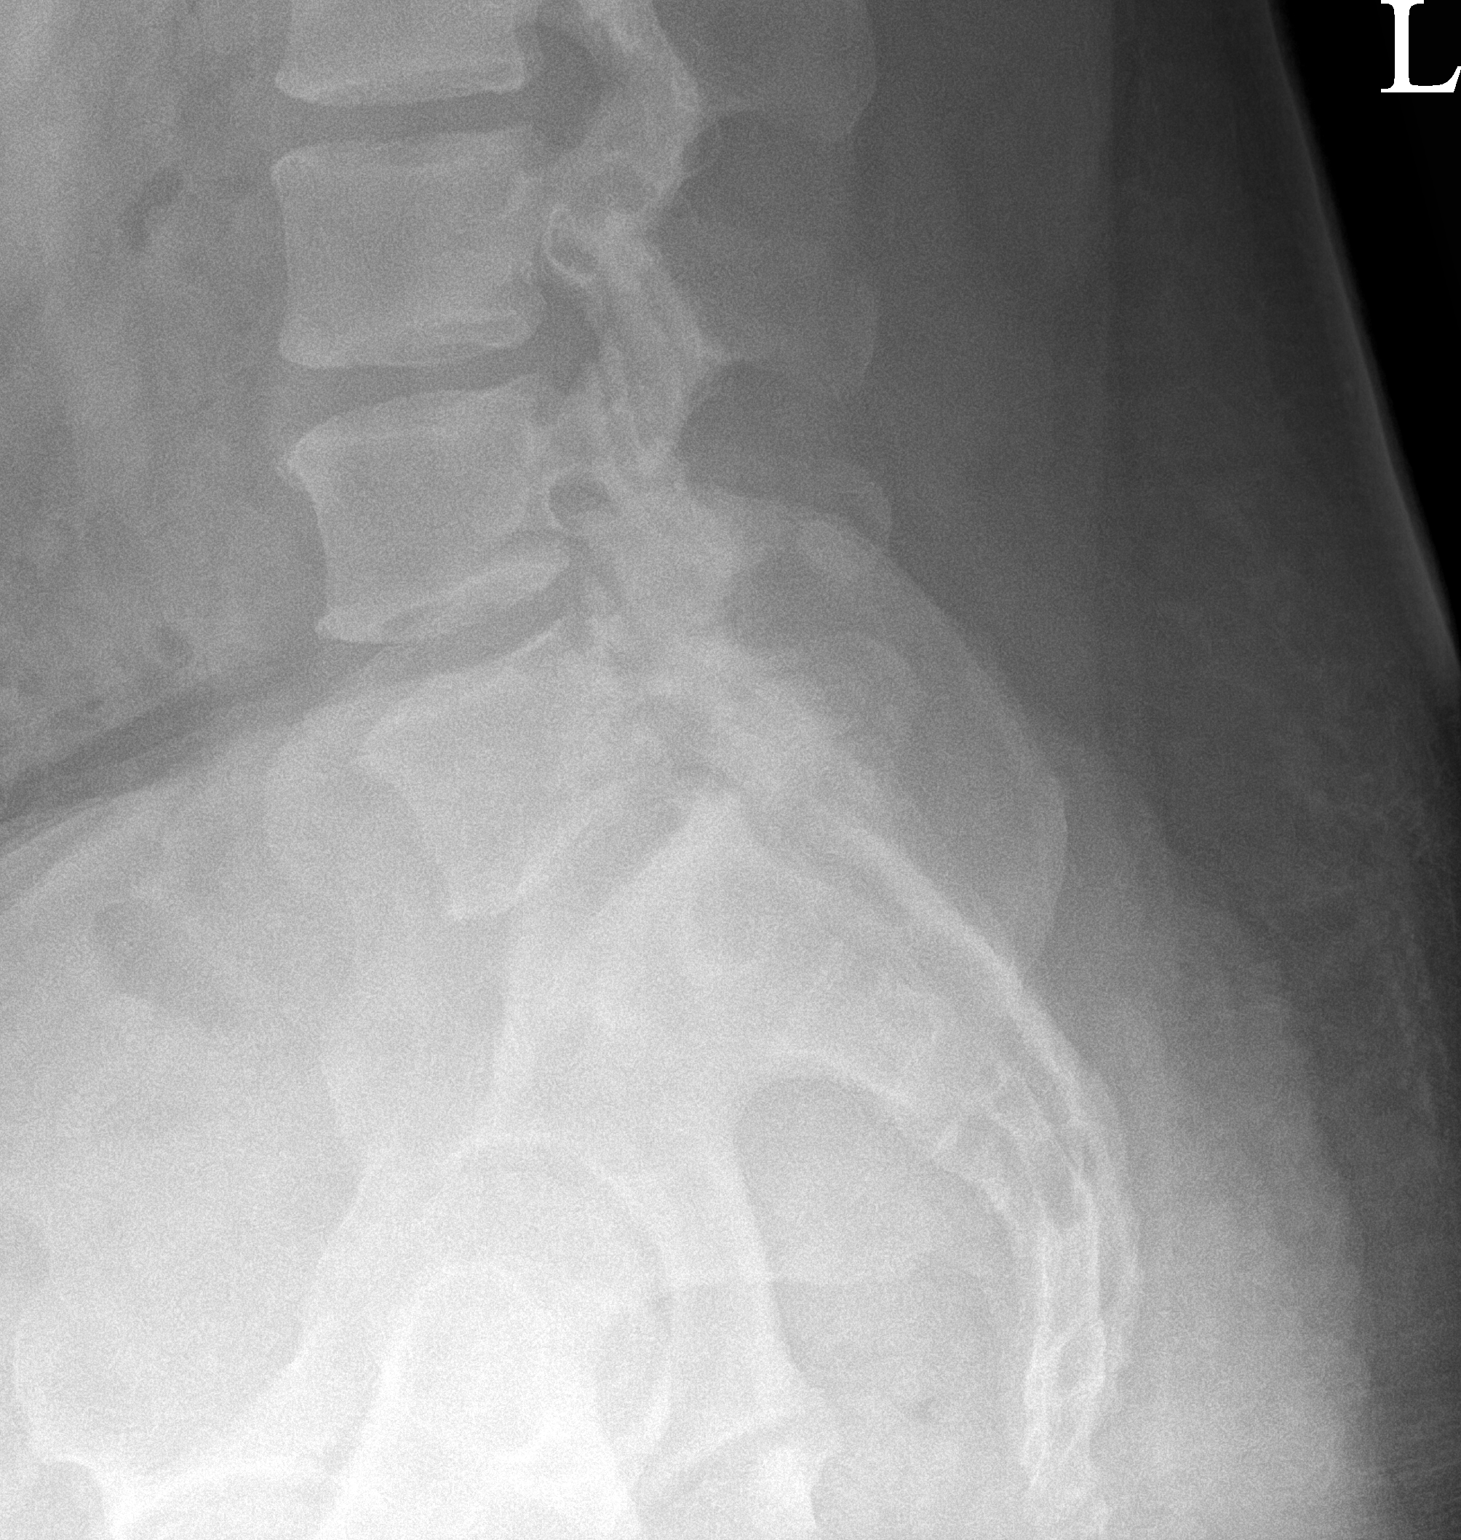

[3 of 3 positions shown; findings below may reference images not displayed]

FINDINGS: Lumbar spine numbered the lowest segmented appearing lumbar shaped
vertebrae on lateral view as L5. Mild multilevel degenerative
endplate osteophyte formation. No acute or focal bony abnormality.
IMPRESSION: Mild multilevel degenerative change. No acute abnormality
identified.

## 2021-08-22 IMAGING — DX DG HIP (WITH OR WITHOUT PELVIS) 2-3V*R*
3 series · 3 of 3 positions shown · non-contrast
Comparison: Lumbar spine series [DATE] right hip series
[DATE].

CLINICAL DATA: Back pain.  Pelvic pain.

EXAM:
DG HIP (WITH OR WITHOUT PELVIS) 2-3V RIGHT

[pelvis ap]
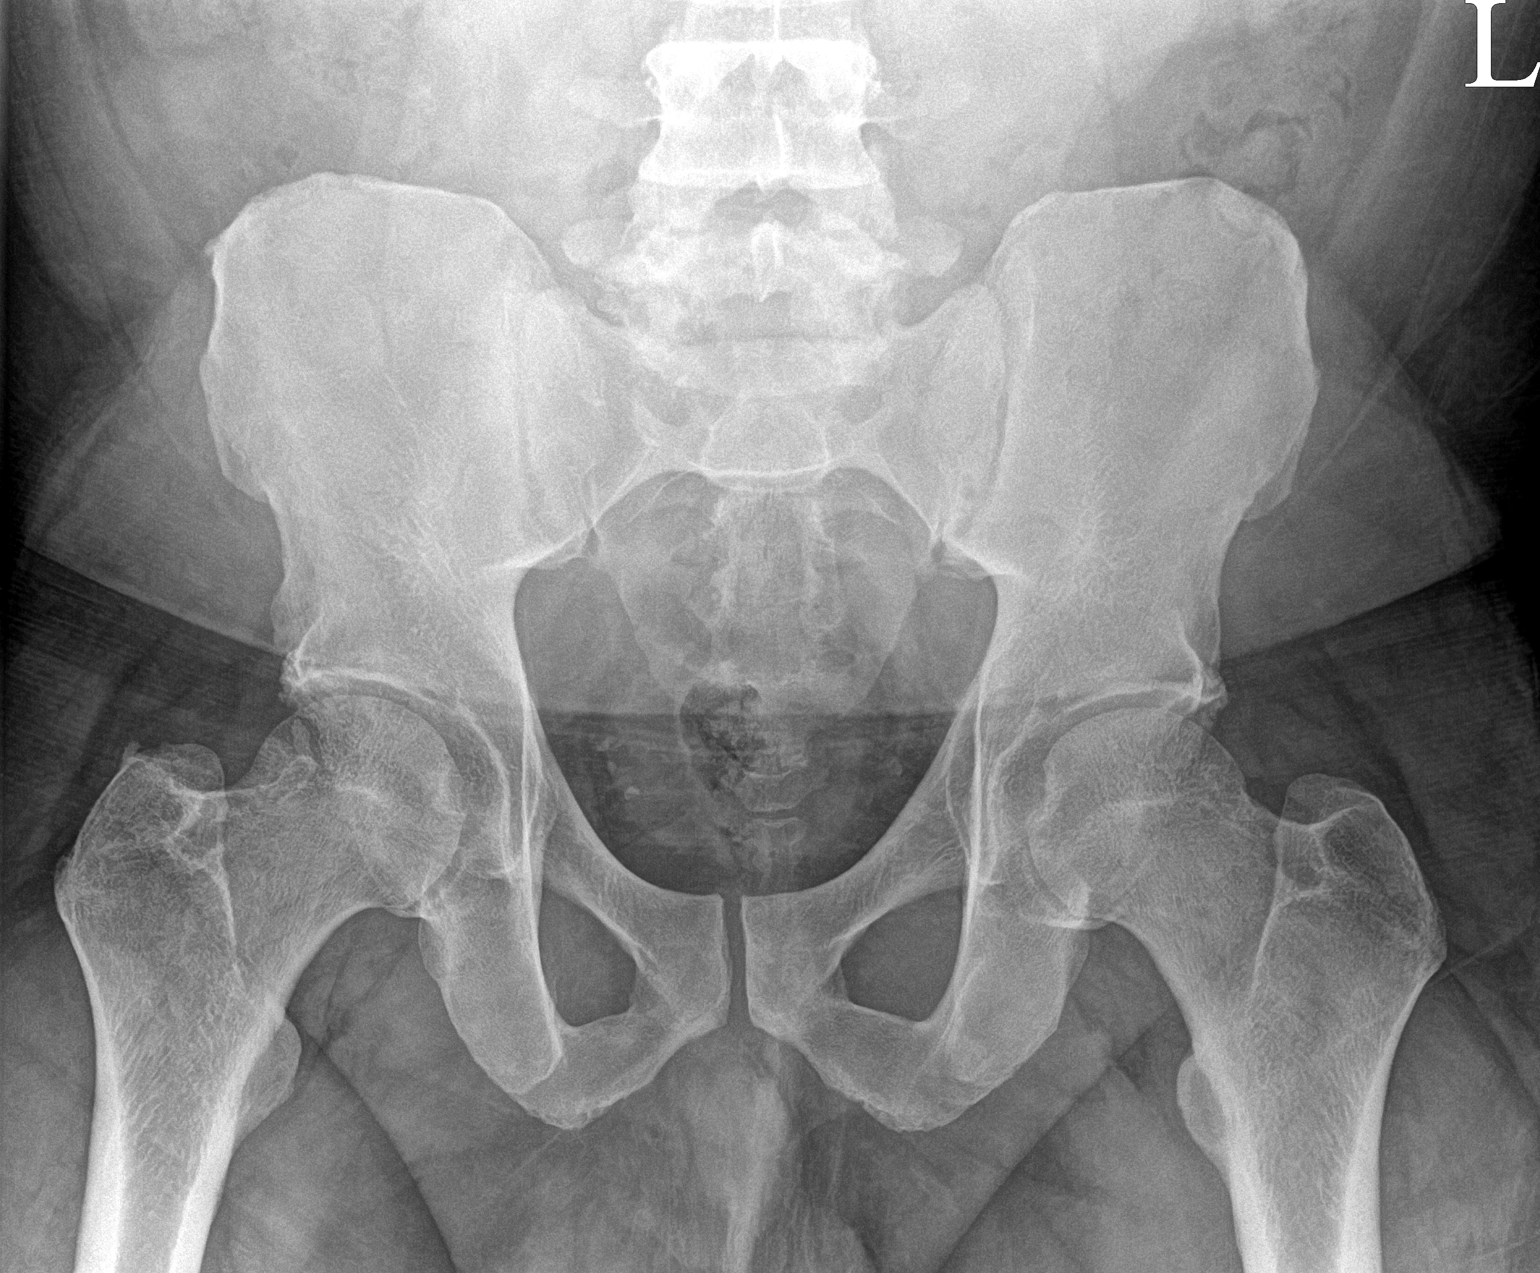

[hip ap]
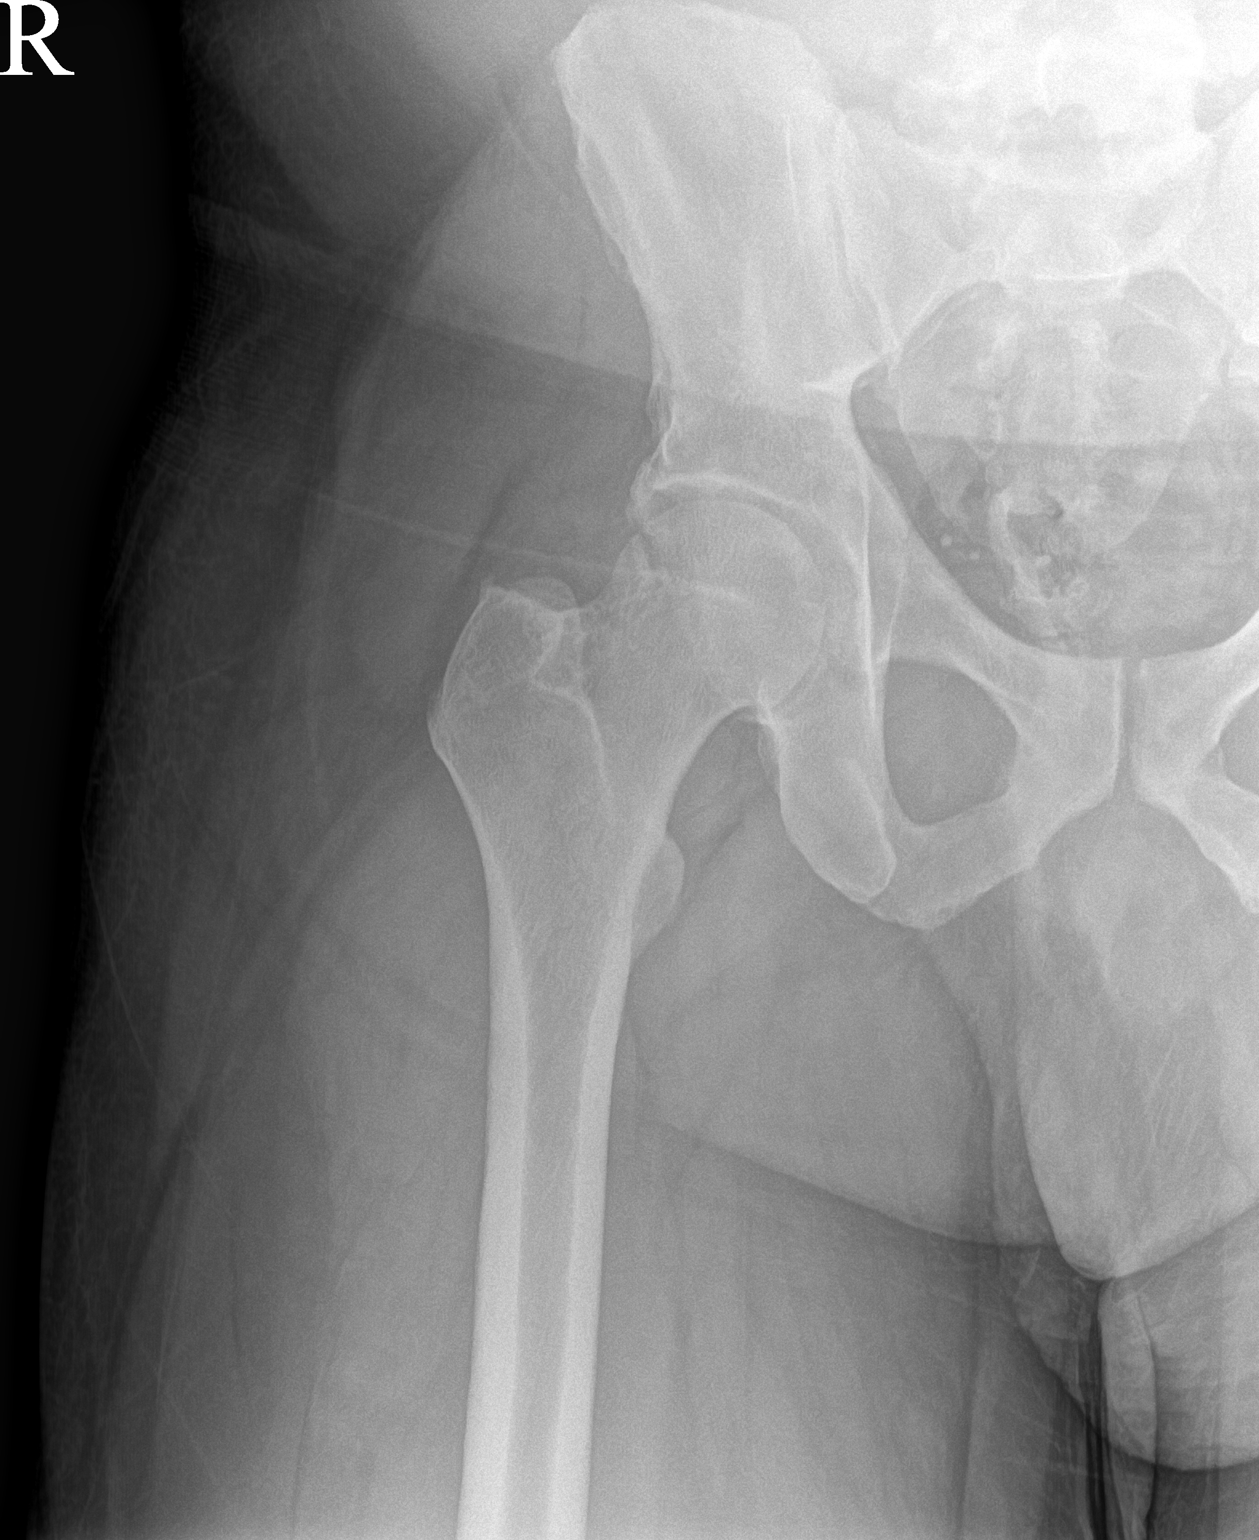

[hip frog leg]
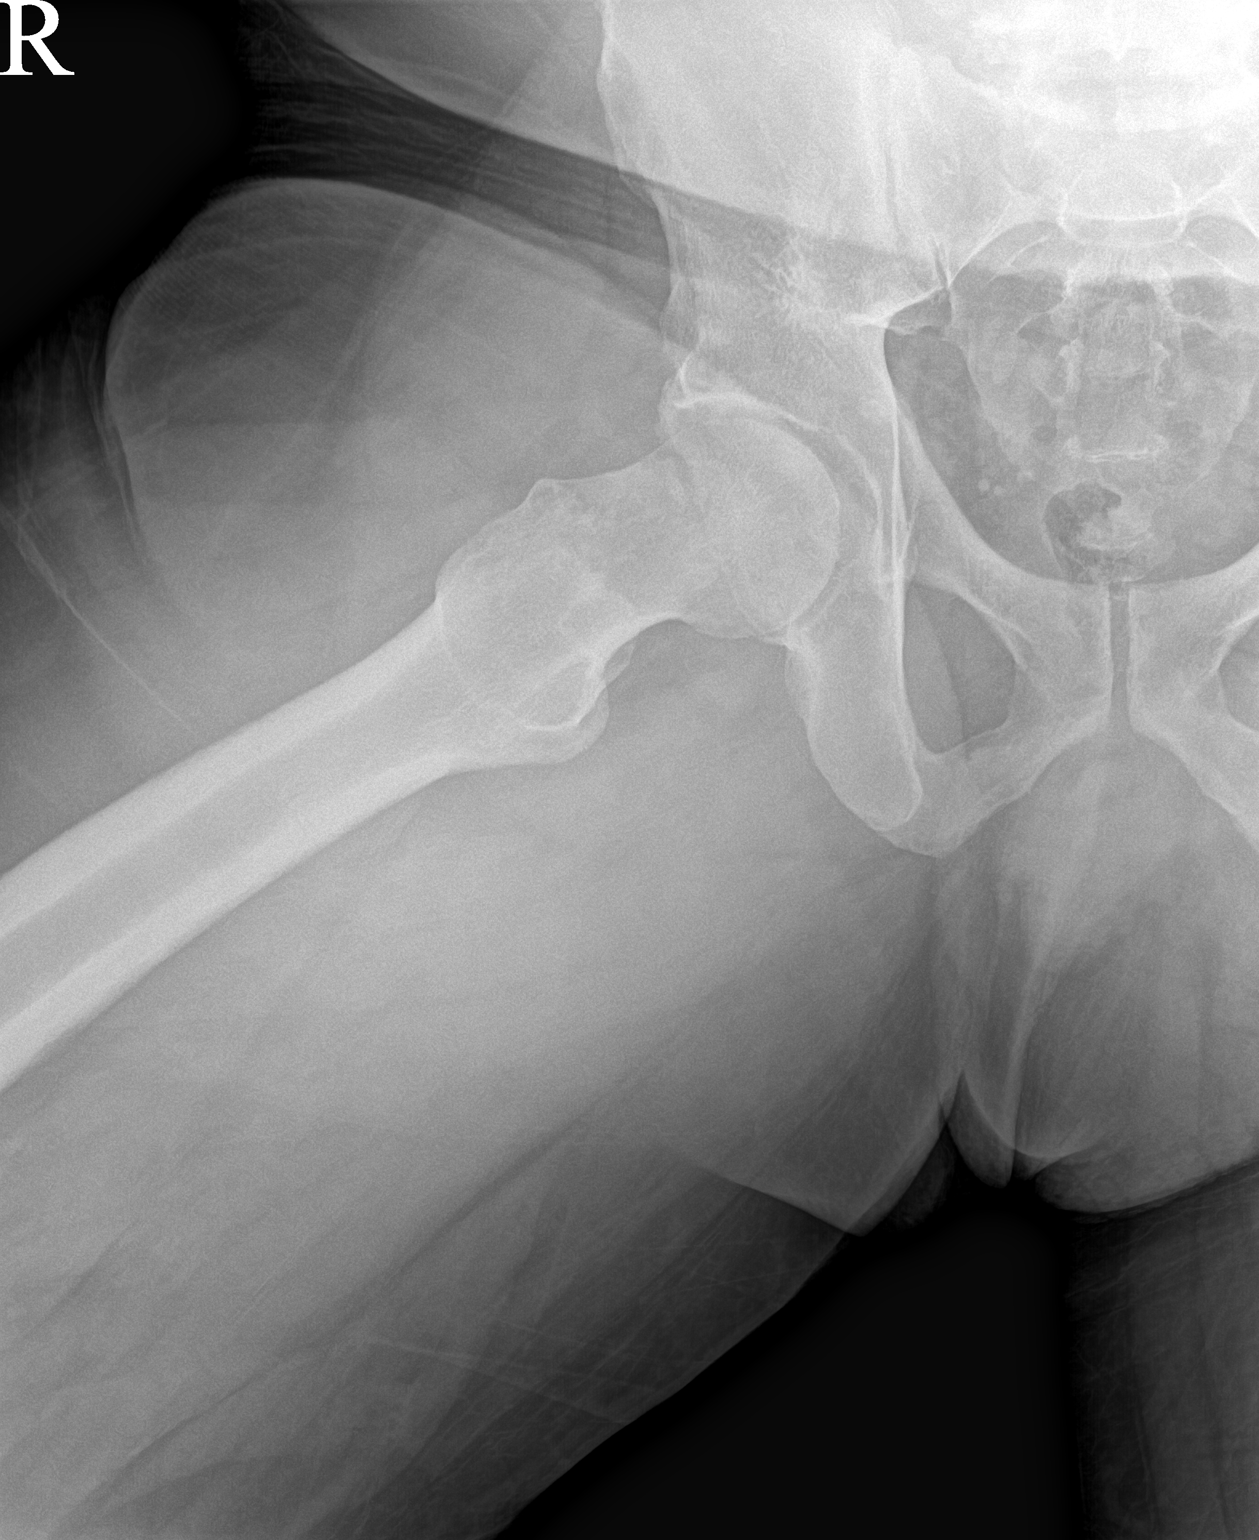

[3 of 3 positions shown; findings below may reference images not displayed]

FINDINGS: Degenerative changes lumbar spine and both hips. Bony density noted
adjacent to the right hip is again noted and unchanged in
appearance. This is most likely degenerative or secondary to a prior
injury. No acute abnormality identified. No evidence of acute
fracture or dislocation. Pelvic calcifications consistent
phleboliths.
IMPRESSION: Degenerative changes lumbar spine and both hips. No acute
abnormality identified. Right hip is intact.

## 2021-08-22 MED ORDER — PREDNISONE 50 MG PO TABS
50.0000 mg | ORAL_TABLET | Freq: Every day | ORAL | 0 refills | Status: DC
  Filled 2021-08-22: qty 5, 5d supply, fill #0

## 2021-08-22 MED ORDER — GABAPENTIN 100 MG PO CAPS
200.0000 mg | ORAL_CAPSULE | Freq: Every day | ORAL | 0 refills | Status: DC
  Filled 2021-08-22: qty 180, 90d supply, fill #0

## 2021-08-22 NOTE — Assessment & Plan Note (Signed)
Right lumbar radiculopathy.  Is concerned with patient having worsening pain with flexion and extension.  Could have some potential instability with radicular symptoms in the L5 and S1 distribution.  Patient's pain seems to be worsening as well.  We will get x-rays and secondary to the weakness noted with the plantarflexion of the foot I do feel that the possibility of an MRI would be beneficial as well.  Patient given a low dose of prednisone as well as the gabapentin.  We will see how patient responds.  Worsening symptoms I do feel that even further work-up may be necessary in the femur but I think this is highly unlikely.  Reviewed patient's x-rays of the right hip from 2014 and did have an ossification area.  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  Depending on the x-ray findings may extend the MRI of the lumbar to MRI lumbar and pelvis.

## 2021-08-22 NOTE — Assessment & Plan Note (Signed)
Patient's last uric acid was 7.5 and with patient being on treatment goal would be 6.  We will send message to primary care to see if there is a chance for potentially decreasing the hydrochlorothiazide and possibly increasing the losartan.  We will leave this with primary care to see if that is appropriate though.

## 2021-08-22 NOTE — Assessment & Plan Note (Signed)
Patient's hamstring seems to be improving at this moment the patient is having more signs and symptoms consistent with radicular symptoms.  This could be contributing to more of the increasing the discomfort of the pain.  Patient given prednisone as well as the gabapentin to try and see if this will be beneficial.  Hopefully this will help.  Discussed with patient also had an elevated ESR that could be contributing to more of the pain.  No masses appreciated at the moment.  Follow-up with me again after the imaging for the back and discuss further evaluation and treatment.

## 2021-08-22 NOTE — Patient Instructions (Addendum)
Prednisone 50mg  daily for 5 days Gabapentin  200mg  at night Will ask PCP to decrease HTZC and increase Losartan [facility] [phone] Call Today  When we receive your results we will contact you.  Heel lift for right side See you again in 4-6 weeks, but we'll talk before then

## 2021-09-12 ENCOUNTER — Ambulatory Visit
Admission: RE | Admit: 2021-09-12 | Discharge: 2021-09-12 | Disposition: A | Discharge status: Home / Self Care | Payer: 59 | Source: Ambulatory Visit | Attending: Family Medicine | Admitting: Family Medicine

## 2021-09-12 ENCOUNTER — Encounter: Payer: Self-pay | Admitting: Family Medicine

## 2021-09-12 ENCOUNTER — Other Ambulatory Visit: Payer: Self-pay

## 2021-09-12 DIAGNOSIS — S76311A Strain of muscle, fascia and tendon of the posterior muscle group at thigh level, right thigh, initial encounter: Secondary | ICD-10-CM

## 2021-09-12 DIAGNOSIS — M5127 Other intervertebral disc displacement, lumbosacral region: Secondary | ICD-10-CM | POA: Diagnosis not present

## 2021-09-12 DIAGNOSIS — M47817 Spondylosis without myelopathy or radiculopathy, lumbosacral region: Secondary | ICD-10-CM

## 2021-09-12 IMAGING — MR MR LUMBAR SPINE W/O CM
4 of 5 series · 25 of 48 positions shown · non-contrast
Comparison: None.

CLINICAL DATA: Right low back pain, posterior leg pain

EXAM:
MRI LUMBAR SPINE WITHOUT CONTRAST
TECHNIQUE: Multiplanar, multisequence MR imaging of the lumbar spine was
performed. No intravenous contrast was administered.

[Series 4: T1 · sagittal · 4.0mm · 1.09mm/px · 5 of 15 slices shown (1 of 2)]
[im 1/15]
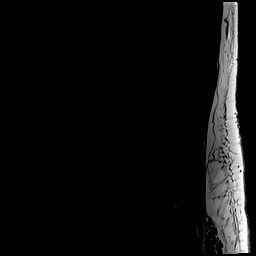
[im 4/15]
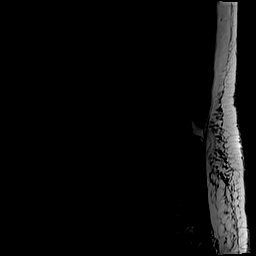
[im 8/15]
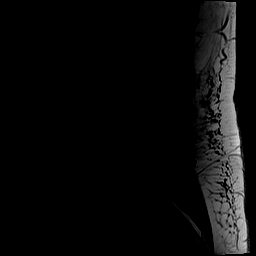
[im 11/15]
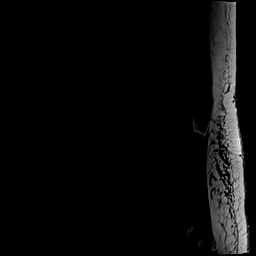
[im 15/15]
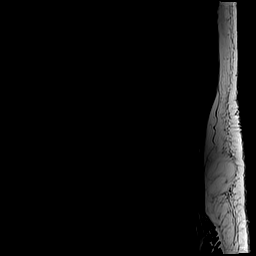

[Series 5: T2 · sagittal · 4.0mm · 1.09mm/px · 5 of 15 slices shown (1 of 2)]
[im 1/15]
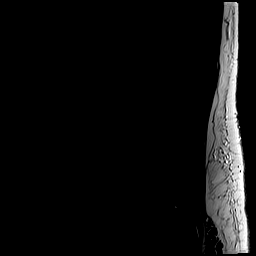
[im 4/15]
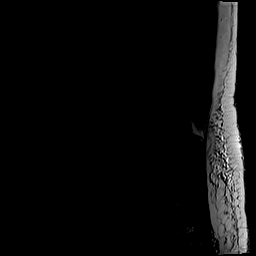
[im 8/15]
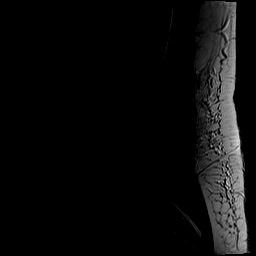
[im 11/15]
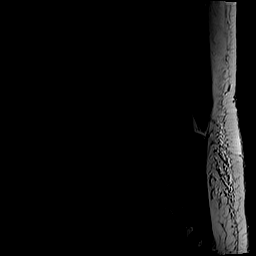
[im 15/15]
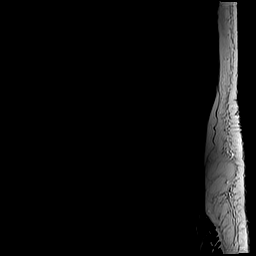

[Series 6: T2 · axial · 4.0mm · 0.39mm/px · z∈[-88,+131]mm · 10 of 45 slices shown (2 of 2)]
[im 3/45]
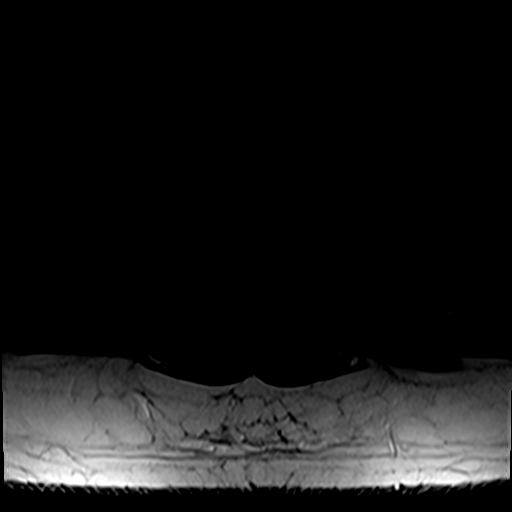
[im 6/45]
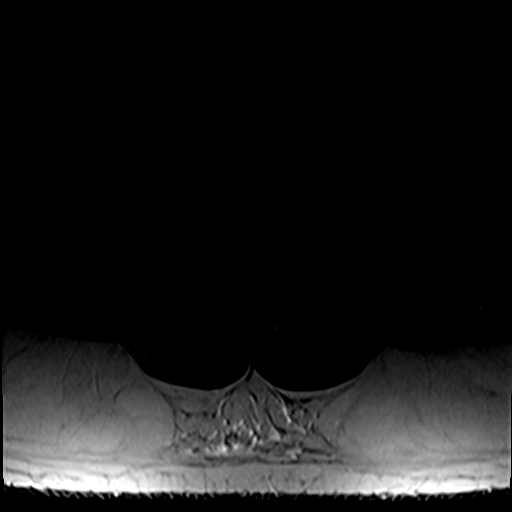
[im 9/45]
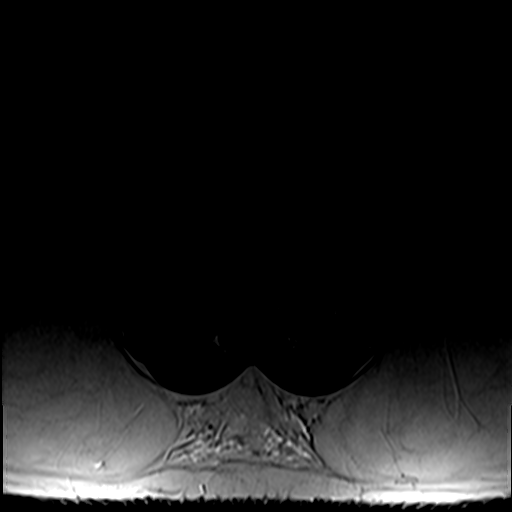
[im 15/45]
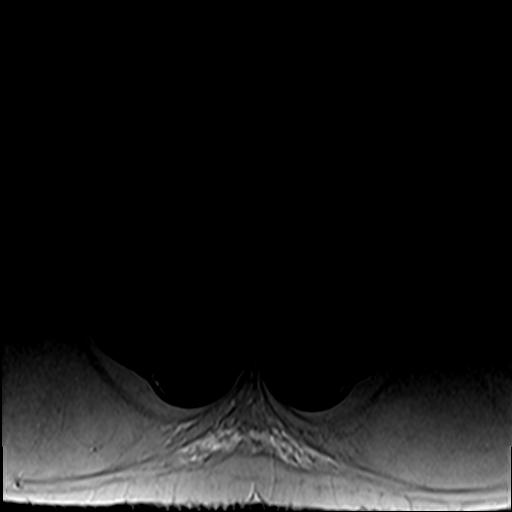
[im 21/45]
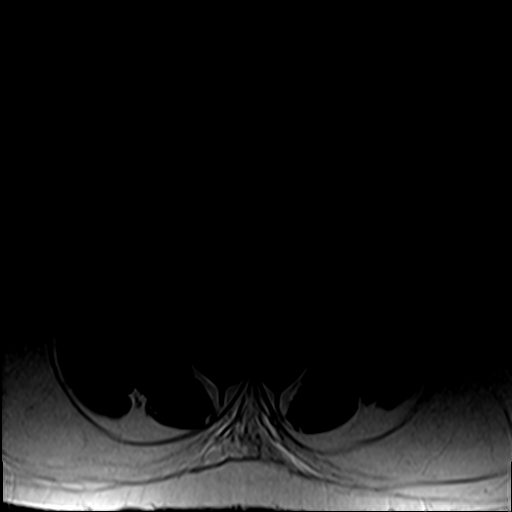
[im 24/45]
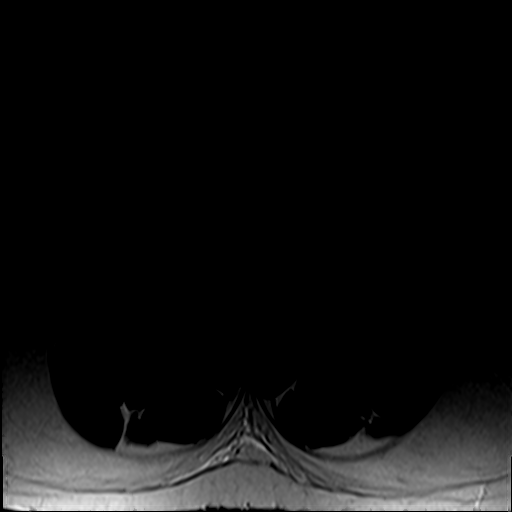
[im 27/45]
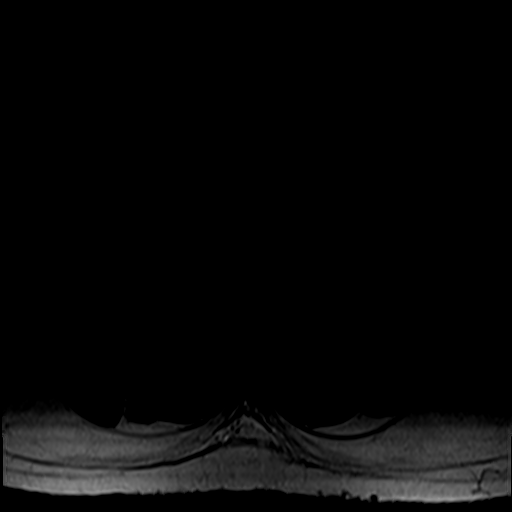
[im 33/45]
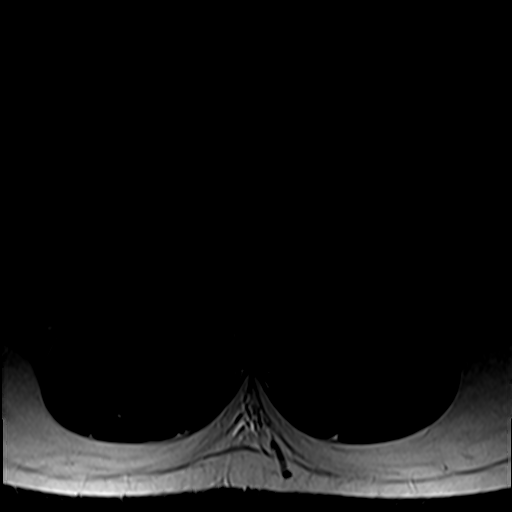
[im 39/45]
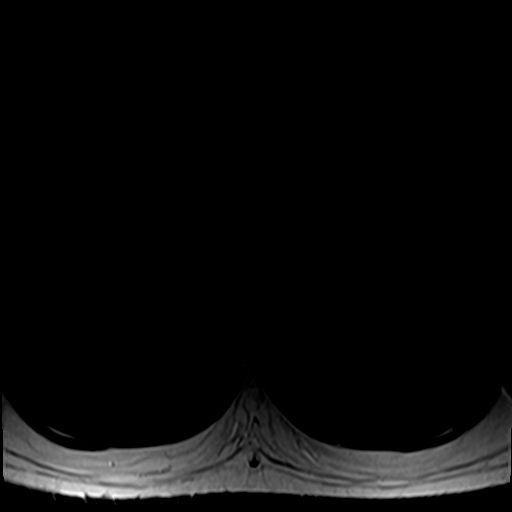
[im 45/45]
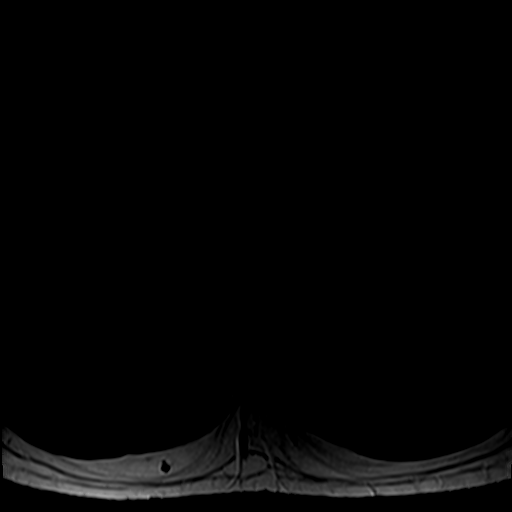

[Series 7: T1 · axial · 4.0mm · 0.39mm/px · z∈[-88,+102]mm · 5 of 45 slices shown (2 of 2)]
[im 3/45]
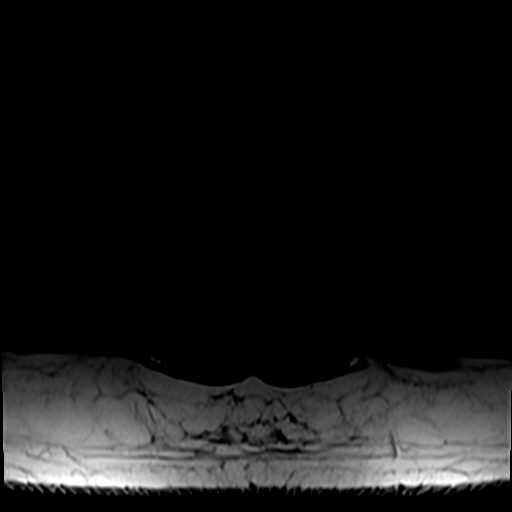
[im 6/45]
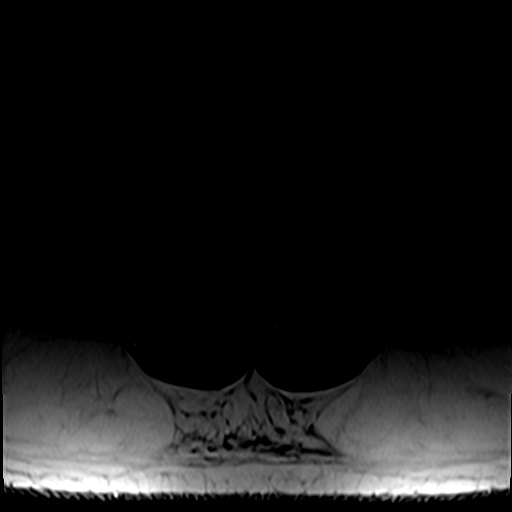
[im 9/45]
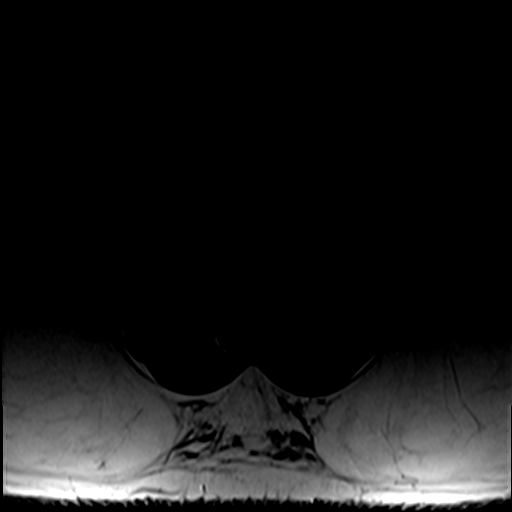
[im 24/45]
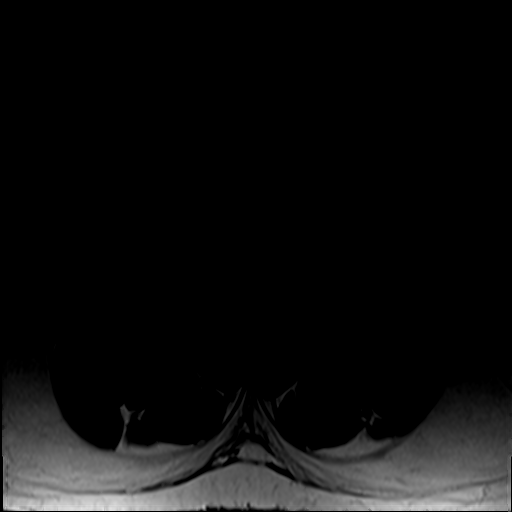
[im 39/45]
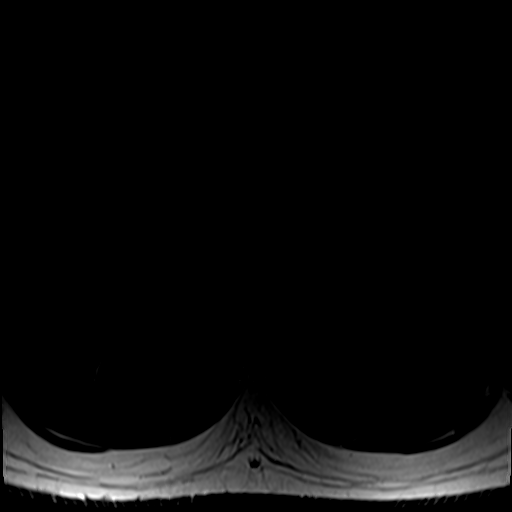

[25 of 48 positions shown; findings below may reference images not displayed]

FINDINGS: Segmentation:  Standard.

Alignment:  Physiologic.

Vertebrae: No acute fracture, evidence of discitis, or aggressive
bone lesion.

Conus medullaris and cauda equina: Conus extends to the L1 level.
Conus and cauda equina appear normal.

Paraspinal and other soft tissues: No acute paraspinal abnormality.

Disc levels:

Disc spaces: Disc spaces are maintained.

T12-L1: No significant disc bulge. No neural foraminal stenosis. No
central canal stenosis.

L1-L2: No significant disc bulge. No neural foraminal stenosis. No
central canal stenosis.

L2-L3: No significant disc bulge. No neural foraminal stenosis. No
central canal stenosis.

L3-L4: No significant disc bulge. No neural foraminal stenosis. No
central canal stenosis. Mild bilateral facet arthropathy.

L4-L5: Broad-based disc bulge. Moderate bilateral facet arthropathy.
Bilateral lateral recess narrowing. Prominence of the epidural fat
as can be seen with epidural lipomatosis. No foraminal stenosis.

L5-S1: Broad-based disc bulge. Severe bilateral facet arthropathy
with bilateral facet effusions and a 2 mm right facet synovial cyst
projecting into the lateral aspect of the right foramen. Mild
bilateral foraminal narrowing. Prominence of the epidural fat
deforming the thecal sac as can be seen with epidural lipomatosis.
No spinal stenosis.
IMPRESSION: 1. At L4-5 there is a broad-based
disc bulge. Moderate bilateral facet arthropathy. Bilateral lateral
recess narrowing. Prominence of the epidural fat as can be seen with
epidural lipomatosis. No foraminal stenosis.
2. At L5-S1 there is a broad-based disc bulge. Severe bilateral
facet arthropathy with bilateral facet effusions and a 2 mm right
facet synovial cyst projecting into the lateral aspect of the right
foramen. Mild bilateral foraminal narrowing. Prominence of the
epidural fat deforming the thecal sac as can be seen with epidural
lipomatosis. No spinal stenosis.

## 2021-09-12 NOTE — Telephone Encounter (Signed)
Facet injection ordered 

## 2021-10-05 ENCOUNTER — Ambulatory Visit: Payer: 59 | Admitting: Family Medicine

## 2021-10-05 ENCOUNTER — Other Ambulatory Visit: Payer: Self-pay

## 2021-10-05 ENCOUNTER — Ambulatory Visit
Admission: RE | Admit: 2021-10-05 | Discharge: 2021-10-05 | Disposition: A | Discharge status: Home / Self Care | Payer: 59 | Source: Ambulatory Visit | Attending: Family Medicine | Admitting: Family Medicine

## 2021-10-05 DIAGNOSIS — M5416 Radiculopathy, lumbar region: Secondary | ICD-10-CM | POA: Diagnosis not present

## 2021-10-05 DIAGNOSIS — M47817 Spondylosis without myelopathy or radiculopathy, lumbosacral region: Secondary | ICD-10-CM

## 2021-10-05 IMAGING — XA DG FACET JT INJ L OR S SPINE SINGLE LEVEL UNI
3 series · 3 of 3 positions shown · non-contrast
Comparison: MRI [DATE]

CLINICAL DATA: Right low back pain with radiation to the right hip
and thigh. Right L5-S1 facet arthropathy

EXAM:
Right L5-S1 facet injection

[Series 1: ortho standard · 1 of 1 slices shown (1 of 3)]
[im 1/1]
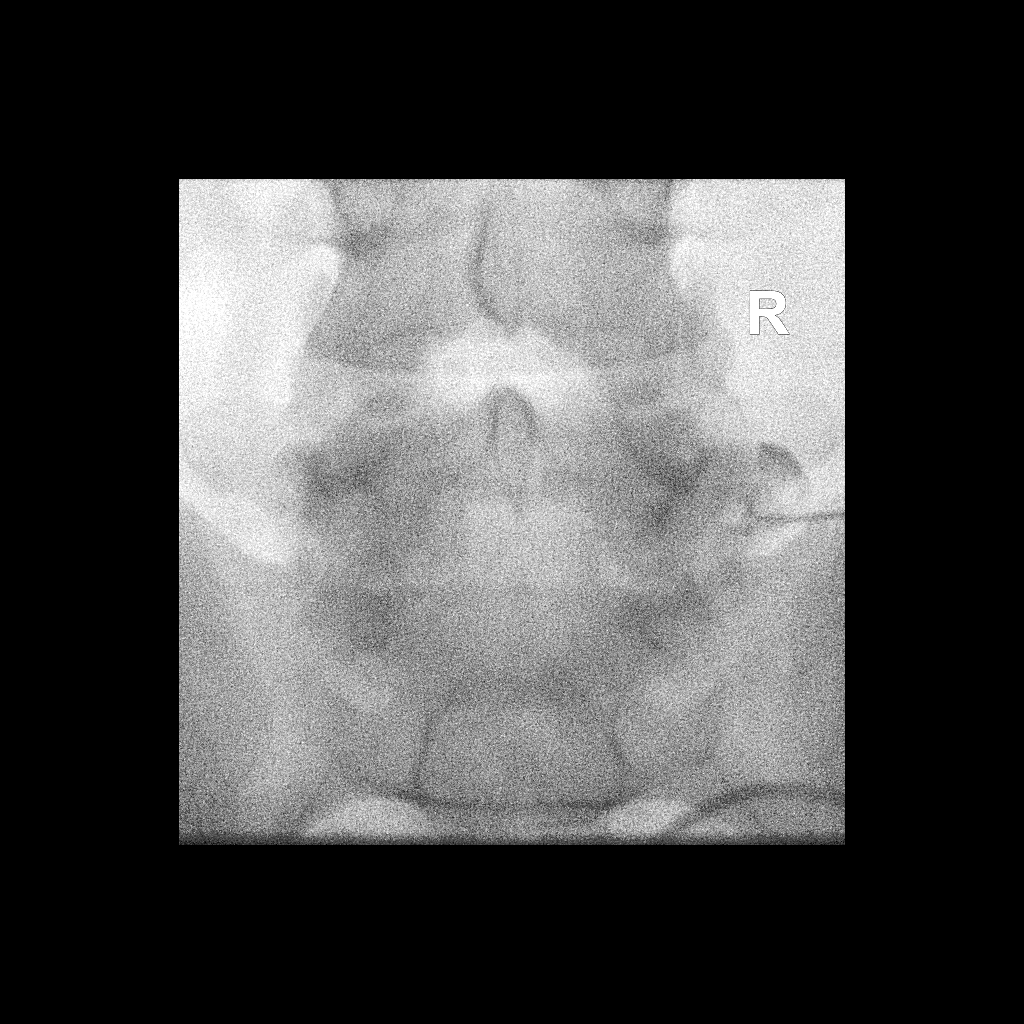

[Series 2: ortho standard · 1 of 1 slices shown (2 of 3)]
[im 1/1]
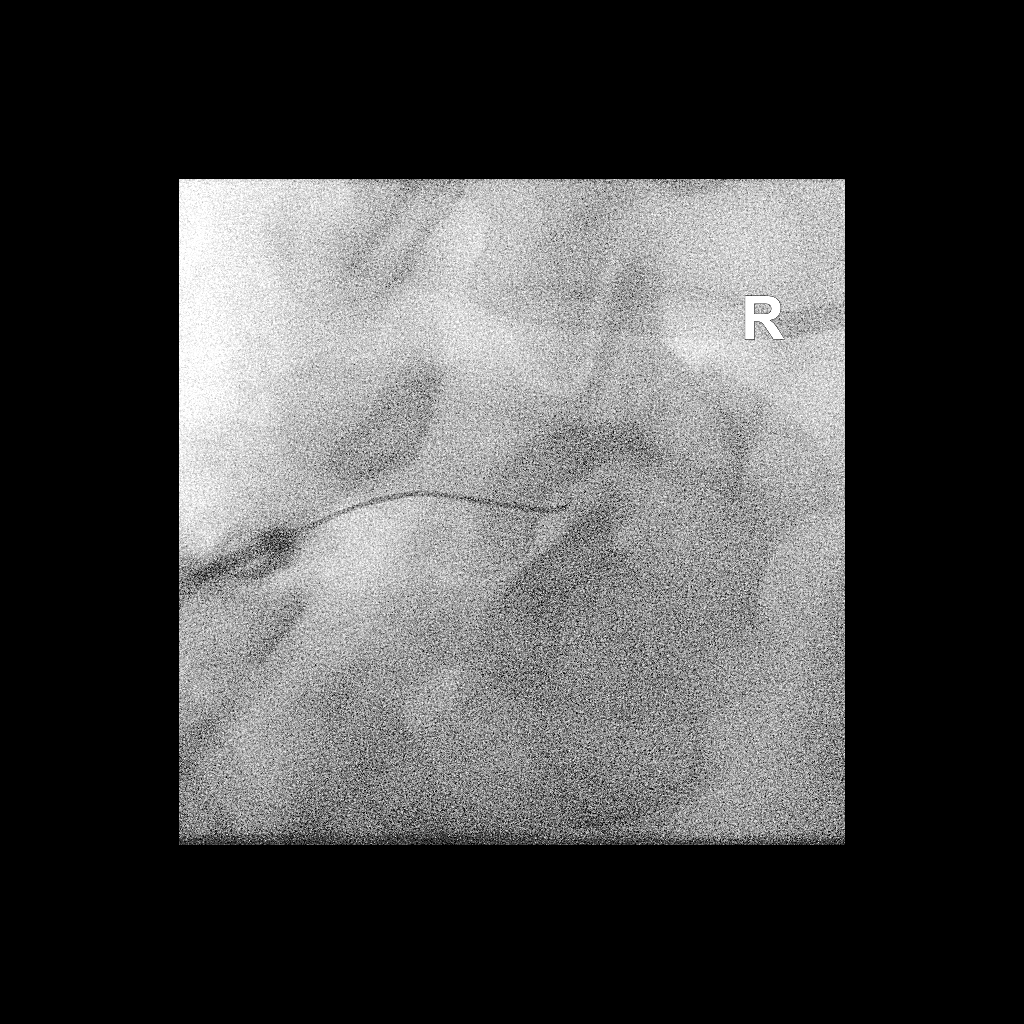

[Series 3: ortho standard · 1 of 1 slices shown (3 of 3)]
[im 1/1]
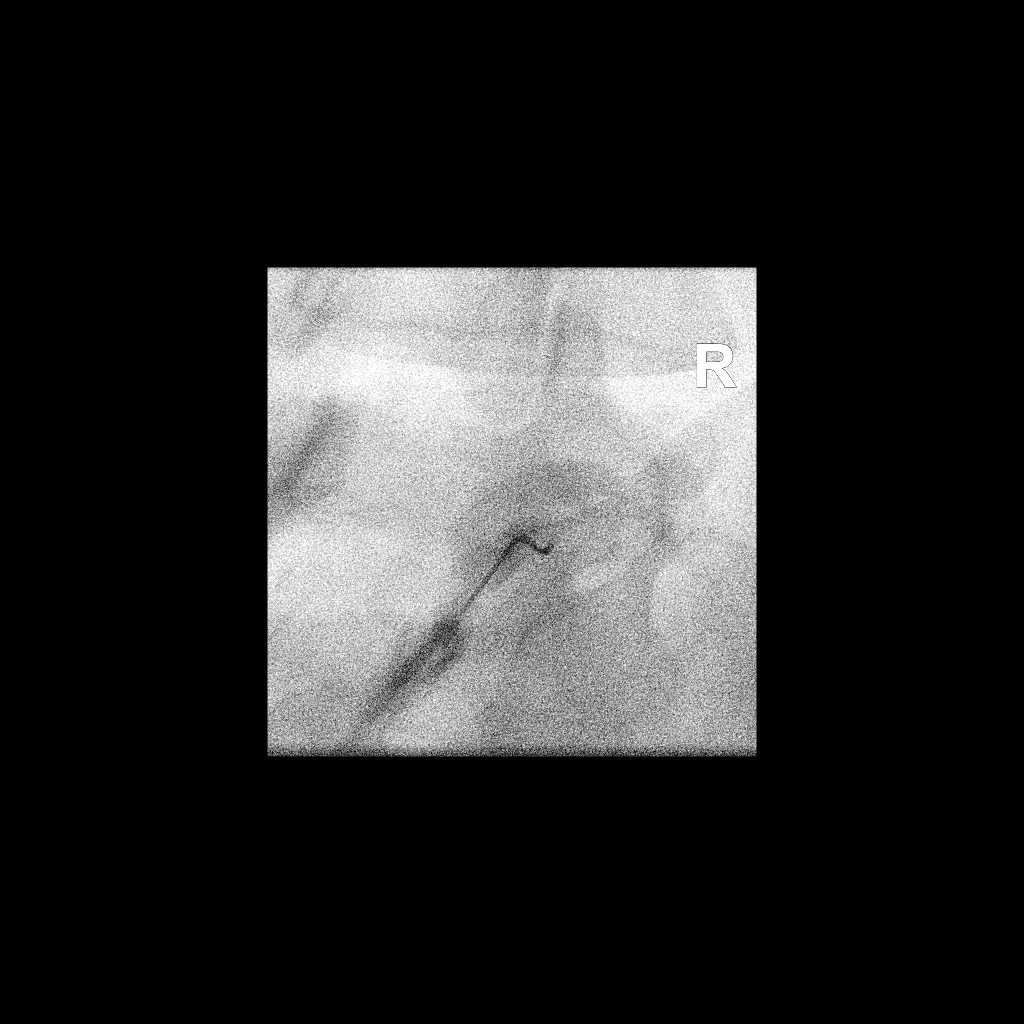

[3 of 3 positions shown; findings below may reference images not displayed]

PROCEDURE:
The procedure, risks, benefits, and alternatives were explained to
the patient. Questions regarding the procedure were encouraged and
answered. The patient understands and consents to the procedure.

Posterior oblique approach to the right L5-S1 facet joint was
performed using a 5 inch 22 gauge spinal needle. Intraarticular
position was confirmed by injecting a few drops of Isovue 200. 80 mg
Depo-Medrol mixed with 0.5 cc 0.25% bupivacaine were injected into
the joint. Injection resulted in concordant pain. Procedure was
well-tolerated.

FLUOROSCOPY TIME:  1 minute 27 seconds. 84.08 micro gray meter
squared
IMPRESSION: Technically successful [J5]-[J5] injection .

## 2021-10-05 MED ORDER — METHYLPREDNISOLONE ACETATE 40 MG/ML INJ SUSP (RADIOLOG
80.0000 mg | Freq: Once | INTRAMUSCULAR | Status: AC
  Administered 2021-10-05: 80 mg via INTRA_ARTICULAR

## 2021-10-05 MED ORDER — IOPAMIDOL (ISOVUE-M 200) INJECTION 41%
1.0000 mL | Freq: Once | INTRAMUSCULAR | Status: AC
  Administered 2021-10-05: 1 mL via INTRA_ARTICULAR

## 2021-10-05 NOTE — Discharge Instructions (Signed)

## 2021-10-06 ENCOUNTER — Telehealth: Payer: Self-pay | Admitting: Urology

## 2021-10-06 NOTE — Telephone Encounter (Signed)
DOS - 10/26/21  CHEILECTOMY RIGHT --- 79499   [facility] EFFECTIVE DATE - 10/29/20   PLAN DEDUCTIBLE - This does not apply to your plan  OUT OF POCKET -  $4000.00 W/ $3,478.87 REMAINING COINSURANCE - 40% COPAY - $0.00   SPOKE WITH SIERRA WITH [facility] AND SHE STATED THAT FOR CPT CODE 71820 NO PIROR AUTH IS REQUIRED.   REF # [id number]

## 2021-10-08 ENCOUNTER — Other Ambulatory Visit: Payer: Self-pay | Admitting: Primary Care

## 2021-10-08 DIAGNOSIS — I10 Essential (primary) hypertension: Secondary | ICD-10-CM

## 2021-10-09 ENCOUNTER — Other Ambulatory Visit: Payer: Self-pay

## 2021-10-09 MED ORDER — HYDROCHLOROTHIAZIDE 25 MG PO TABS
25.0000 mg | ORAL_TABLET | Freq: Every day | ORAL | 2 refills | Status: DC
  Filled 2021-10-09 – 2021-10-27 (×2): qty 90, 90d supply, fill #0
  Filled 2022-02-05: qty 90, 90d supply, fill #1
  Filled 2022-04-11 – 2022-06-06 (×3): qty 90, 90d supply, fill #2

## 2021-10-09 MED ORDER — AMLODIPINE BESYLATE 10 MG PO TABS
ORAL_TABLET | Freq: Every day | ORAL | 2 refills | Status: DC
  Filled 2021-10-09: qty 90, fill #0
  Filled 2021-10-25: qty 30, 30d supply, fill #0
  Filled 2021-10-27: qty 90, 90d supply, fill #0
  Filled 2022-02-05: qty 90, 90d supply, fill #1
  Filled 2022-04-11 – 2022-06-06 (×3): qty 90, 90d supply, fill #2

## 2021-10-10 ENCOUNTER — Telehealth: Payer: Self-pay | Admitting: Urology

## 2021-10-10 ENCOUNTER — Other Ambulatory Visit: Payer: Self-pay

## 2021-10-10 NOTE — Telephone Encounter (Signed)
Pt called stating that he needs to cxl his sx on 10/26/21 due to an issue he is having with his back and he wants to have it looked at and fixed before he has this sx. He stated he would call back to reschedule his sx. I have informed Shelly, Dr. Amalia Hailey and Caren Griffins with [facility] with this change.

## 2021-10-16 ENCOUNTER — Other Ambulatory Visit (HOSPITAL_COMMUNITY): Payer: Self-pay

## 2021-10-25 ENCOUNTER — Other Ambulatory Visit: Payer: Self-pay

## 2021-10-27 ENCOUNTER — Other Ambulatory Visit (HOSPITAL_COMMUNITY): Payer: Self-pay

## 2021-10-27 ENCOUNTER — Other Ambulatory Visit: Payer: Self-pay

## 2021-11-06 ENCOUNTER — Encounter: Payer: 59 | Admitting: Podiatry

## 2021-11-09 ENCOUNTER — Other Ambulatory Visit: Payer: Self-pay

## 2021-11-09 ENCOUNTER — Encounter: Payer: Self-pay | Admitting: Family Medicine

## 2021-11-09 DIAGNOSIS — M5416 Radiculopathy, lumbar region: Secondary | ICD-10-CM

## 2021-11-13 ENCOUNTER — Encounter: Payer: 59 | Admitting: Podiatry

## 2021-11-15 ENCOUNTER — Telehealth: Payer: Self-pay | Admitting: Internal Medicine

## 2021-11-15 DIAGNOSIS — R7303 Prediabetes: Secondary | ICD-10-CM

## 2021-11-15 DIAGNOSIS — R0683 Snoring: Secondary | ICD-10-CM

## 2021-11-15 NOTE — Telephone Encounter (Signed)
Called and spoke with patient about getting a prescription for a traveling CPAP machine. Advised patient that he would have to pay out of pocket for this machine and patient stated that he was aware. 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   Dr Annamaria Boots please advise

## 2021-11-16 NOTE — Telephone Encounter (Signed)
Called and spoke with patient abut Dr Annamaria Boots approving second cpap machine for travel. Sent in through [facility] per pts request. Pt is aware that he will have to pay out of pocket. Nothing further needed at this time

## 2021-11-16 NOTE — Telephone Encounter (Signed)
Ok to print order for on-ine use, or send to [facility]   Travel CPAP machine of choice, auto 4-20, mask of choice, filters, tubing and supplies  He was last seen in 2020. Please schedule return ov- me or APP, to maintain contact.

## 2021-11-27 ENCOUNTER — Encounter: Payer: 59 | Admitting: Podiatry

## 2021-12-05 ENCOUNTER — Ambulatory Visit
Admission: RE | Admit: 2021-12-05 | Discharge: 2021-12-05 | Disposition: A | Discharge status: Home / Self Care | Payer: 59 | Source: Ambulatory Visit | Attending: Family Medicine | Admitting: Family Medicine

## 2021-12-05 DIAGNOSIS — M5416 Radiculopathy, lumbar region: Secondary | ICD-10-CM

## 2021-12-05 DIAGNOSIS — M47817 Spondylosis without myelopathy or radiculopathy, lumbosacral region: Secondary | ICD-10-CM | POA: Diagnosis not present

## 2021-12-05 IMAGING — XA DG FACET JT INJ L OR S SPINE SINGLE LEVEL UNI
1 series · 1 of 1 positions shown · non-contrast
Comparison: none

CLINICAL DATA: Lumbosacral spondylosis without myelopathy.
Right-sided low back, hip, and thigh pain. 90% relief for 1 month
after right L5-S1 facet injection 2 months ago. Symptoms have begun
to recur.

[Series 1: ortho adipose · 1 of 1 slices shown]
[im 1/1]
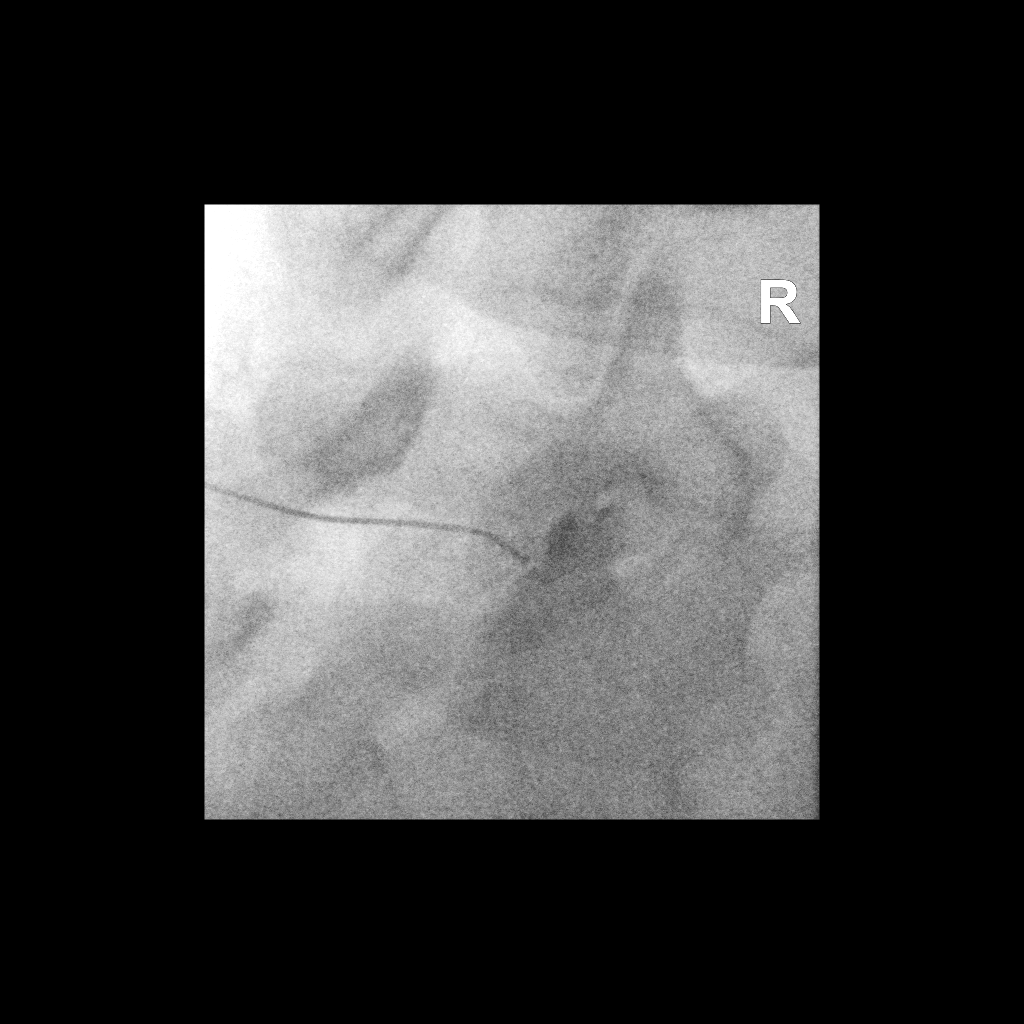

[1 of 1 positions shown; findings below may reference images not displayed]

EXAM:
FLUOROSCOPICALLY GUIDED RIGHT L5-S1 FACET INJECTION

FLUOROSCOPY TIME:
Radiation Exposure Index (as provided by the fluoroscopic device):
4.5 mGy Kerma

PROCEDURE:
The procedure, risks, benefits, and alternatives were explained to
the patient. Questions regarding the procedure were encouraged and
answered. The patient understands and consents to the procedure.

RIGHT L5-S1 FACET INJECTION: A posterior oblique approach was taken
to the facet on the right at L5-S1 using a curved 5 inch 22 gauge
spinal needle. Intra-articular positioning was confirmed by
injecting a small amount of Isovue-M 200. No vascular opacification
is seen. 80 mg of Depo-Medrol mixed with 1 mL of 0.25% bupivacaine
were instilled into the joint. The injection resulted in concordant
pain. The procedure was well-tolerated.
IMPRESSION: Technically successful right L5-S1 facet injection.

## 2021-12-05 MED ORDER — METHYLPREDNISOLONE ACETATE 40 MG/ML INJ SUSP (RADIOLOG
80.0000 mg | Freq: Once | INTRAMUSCULAR | Status: AC
  Administered 2021-12-05: 80 mg via INTRA_ARTICULAR

## 2021-12-05 MED ORDER — IOPAMIDOL (ISOVUE-M 200) INJECTION 41%
1.0000 mL | Freq: Once | INTRAMUSCULAR | Status: AC
  Administered 2021-12-05: 1 mL via INTRA_ARTICULAR

## 2021-12-05 NOTE — Discharge Instructions (Signed)

## 2021-12-08 DIAGNOSIS — Z125 Encounter for screening for malignant neoplasm of prostate: Secondary | ICD-10-CM | POA: Diagnosis not present

## 2021-12-08 DIAGNOSIS — E349 Endocrine disorder, unspecified: Secondary | ICD-10-CM | POA: Diagnosis not present

## 2021-12-25 ENCOUNTER — Telehealth: Payer: Self-pay | Admitting: Internal Medicine

## 2021-12-25 DIAGNOSIS — R0683 Snoring: Secondary | ICD-10-CM

## 2021-12-25 NOTE — Telephone Encounter (Signed)
Called and spoke with patient. He stated that he never heard back from [facility] in regards to getting a cpap machine. He ended up ordering a machine from [email or web address]   He is now requesting to have a RX faxed to [facility] at [phone]. I advised him that once I have the signed RX and receive a confirmation that the fax was successful, I will let him know.

## 2021-12-29 NOTE — Telephone Encounter (Signed)
Joseph Ferguson, please advise if this was taken care of. ?

## 2021-12-29 NOTE — Telephone Encounter (Signed)
Prescription was successfully received by [facility].  ?

## 2022-01-11 ENCOUNTER — Other Ambulatory Visit: Payer: 59

## 2022-01-15 ENCOUNTER — Other Ambulatory Visit (HOSPITAL_COMMUNITY): Payer: Self-pay

## 2022-02-05 ENCOUNTER — Other Ambulatory Visit (HOSPITAL_COMMUNITY): Payer: Self-pay

## 2022-02-17 DIAGNOSIS — H52223 Regular astigmatism, bilateral: Secondary | ICD-10-CM | POA: Diagnosis not present

## 2022-04-11 ENCOUNTER — Other Ambulatory Visit (HOSPITAL_COMMUNITY): Payer: Self-pay

## 2022-05-07 DIAGNOSIS — L03114 Cellulitis of left upper limb: Secondary | ICD-10-CM | POA: Diagnosis not present

## 2022-05-07 DIAGNOSIS — W57XXXA Bitten or stung by nonvenomous insect and other nonvenomous arthropods, initial encounter: Secondary | ICD-10-CM | POA: Diagnosis not present

## 2022-05-07 DIAGNOSIS — S50862A Insect bite (nonvenomous) of left forearm, initial encounter: Secondary | ICD-10-CM | POA: Diagnosis not present

## 2022-05-08 DIAGNOSIS — Z23 Encounter for immunization: Secondary | ICD-10-CM | POA: Diagnosis not present

## 2022-05-08 DIAGNOSIS — Z111 Encounter for screening for respiratory tuberculosis: Secondary | ICD-10-CM | POA: Diagnosis not present

## 2022-05-17 ENCOUNTER — Other Ambulatory Visit (HOSPITAL_COMMUNITY): Payer: Self-pay

## 2022-05-25 ENCOUNTER — Other Ambulatory Visit (HOSPITAL_COMMUNITY): Payer: Self-pay

## 2022-06-06 ENCOUNTER — Other Ambulatory Visit (HOSPITAL_COMMUNITY): Payer: Self-pay

## 2022-07-14 ENCOUNTER — Other Ambulatory Visit: Payer: Self-pay | Admitting: Primary Care

## 2022-07-14 DIAGNOSIS — M1A9XX0 Chronic gout, unspecified, without tophus (tophi): Secondary | ICD-10-CM

## 2022-07-14 DIAGNOSIS — I10 Essential (primary) hypertension: Secondary | ICD-10-CM

## 2022-07-14 DIAGNOSIS — F3342 Major depressive disorder, recurrent, in full remission: Secondary | ICD-10-CM

## 2022-07-15 MED ORDER — ALLOPURINOL 300 MG PO TABS
300.0000 mg | ORAL_TABLET | Freq: Every day | ORAL | 0 refills | Status: DC
  Filled 2022-07-15: qty 90, 90d supply, fill #0

## 2022-07-15 MED ORDER — VENLAFAXINE HCL ER 37.5 MG PO CP24
37.5000 mg | ORAL_CAPSULE | Freq: Every day | ORAL | 0 refills | Status: DC
  Filled 2022-07-15: qty 90, 90d supply, fill #0

## 2022-07-15 MED ORDER — LOSARTAN POTASSIUM 50 MG PO TABS
50.0000 mg | ORAL_TABLET | Freq: Every day | ORAL | 0 refills | Status: DC
Start: 2022-07-15 — End: 2022-10-24
  Filled 2022-07-15: qty 90, 90d supply, fill #0

## 2022-07-16 ENCOUNTER — Other Ambulatory Visit (HOSPITAL_COMMUNITY): Payer: Self-pay

## 2022-07-18 ENCOUNTER — Other Ambulatory Visit (HOSPITAL_COMMUNITY): Payer: Self-pay

## 2022-07-25 ENCOUNTER — Ambulatory Visit (INDEPENDENT_AMBULATORY_CARE_PROVIDER_SITE_OTHER): Payer: 59 | Admitting: Primary Care

## 2022-07-25 ENCOUNTER — Encounter: Payer: Self-pay | Admitting: Primary Care

## 2022-07-25 VITALS — BP 102/68 | HR 60 | Temp 97.1°F | Ht 70.0 in | Wt 259.0 lb

## 2022-07-25 DIAGNOSIS — Z113 Encounter for screening for infections with a predominantly sexual mode of transmission: Secondary | ICD-10-CM

## 2022-07-25 DIAGNOSIS — Z23 Encounter for immunization: Secondary | ICD-10-CM | POA: Diagnosis not present

## 2022-07-25 DIAGNOSIS — R2232 Localized swelling, mass and lump, left upper limb: Secondary | ICD-10-CM | POA: Diagnosis not present

## 2022-07-25 DIAGNOSIS — E785 Hyperlipidemia, unspecified: Secondary | ICD-10-CM

## 2022-07-25 DIAGNOSIS — M5416 Radiculopathy, lumbar region: Secondary | ICD-10-CM | POA: Diagnosis not present

## 2022-07-25 DIAGNOSIS — I10 Essential (primary) hypertension: Secondary | ICD-10-CM | POA: Diagnosis not present

## 2022-07-25 DIAGNOSIS — R7303 Prediabetes: Secondary | ICD-10-CM

## 2022-07-25 DIAGNOSIS — F3342 Major depressive disorder, recurrent, in full remission: Secondary | ICD-10-CM

## 2022-07-25 DIAGNOSIS — Z0000 Encounter for general adult medical examination without abnormal findings: Secondary | ICD-10-CM | POA: Diagnosis not present

## 2022-07-25 DIAGNOSIS — M1A9XX0 Chronic gout, unspecified, without tophus (tophi): Secondary | ICD-10-CM

## 2022-07-25 HISTORY — DX: Localized swelling, mass and lump, left upper limb: R22.32

## 2022-07-25 LAB — URIC ACID: Uric Acid, Serum: 5.9 mg/dL (ref 4.0–7.8)

## 2022-07-25 LAB — C-REACTIVE PROTEIN: CRP: <1 mg/dL (ref 0.5–20.0)

## 2022-07-25 LAB — CBC
HCT: 42.8 % (ref 39.0–52.0)
Hemoglobin: 14.2 g/dL (ref 13.0–17.0)
MCHC: 33.1 g/dL (ref 30.0–36.0)
MCV: 82 fl (ref 78.0–100.0)
Platelets: 222 10*3/uL (ref 150.0–400.0)
RBC: 5.22 Mil/uL (ref 4.22–5.81)
RDW: 16.2 % — ABNORMAL HIGH (ref 11.5–15.5)
WBC: 6.6 10*3/uL (ref 4.0–10.5)

## 2022-07-25 LAB — COMPREHENSIVE METABOLIC PANEL
ALT: 32 U/L (ref 0–53)
AST: 19 U/L (ref 0–37)
Albumin: 4.2 g/dL (ref 3.5–5.2)
Alkaline Phosphatase: 63 U/L (ref 39–117)
BUN: 18 mg/dL (ref 6–23)
CO2: 32 mEq/L (ref 19–32)
Calcium: 9.5 mg/dL (ref 8.4–10.5)
Chloride: 100 mEq/L (ref 96–112)
Creatinine, Ser: 1.23 mg/dL (ref 0.40–1.50)
GFR: 70.34 mL/min (ref >60.00)
Glucose, Bld: 97 mg/dL (ref 70–99)
Potassium: 3.5 mEq/L (ref 3.5–5.1)
Sodium: 139 mEq/L (ref 135–145)
Total Bilirubin: 0.6 mg/dL (ref 0.2–1.2)
Total Protein: 7.6 g/dL (ref 6.0–8.3)

## 2022-07-25 LAB — LIPID PANEL
Cholesterol: 192 mg/dL (ref 0–200)
HDL: 37.5 mg/dL — ABNORMAL LOW (ref >39.00)
LDL Cholesterol: 138 mg/dL — ABNORMAL HIGH (ref 0–99)
NonHDL: 154.79
Total CHOL/HDL Ratio: 5
Triglycerides: 85 mg/dL (ref 0.0–149.0)
VLDL: 17 mg/dL (ref 0.0–40.0)

## 2022-07-25 LAB — SEDIMENTATION RATE: Sed Rate: 38 mm/hr — ABNORMAL HIGH (ref 0–15)

## 2022-07-25 LAB — HEMOGLOBIN A1C: Hgb A1c MFr Bld: 6.2 % (ref 4.6–6.5)

## 2022-07-25 NOTE — Assessment & Plan Note (Signed)
Commended him on weight loss through healthy diet regular exercise. Repeat A1c pending.

## 2022-07-25 NOTE — Assessment & Plan Note (Signed)
Commended him on weight loss through regular exercise and a healthy diet. Repeat lipid panel pending.

## 2022-07-25 NOTE — Assessment & Plan Note (Signed)
Repeat uric acid level pending. Continue allopurinol 300 mg daily for prevention.

## 2022-07-25 NOTE — Assessment & Plan Note (Signed)
Unclear etiology as this does not appear to be affecting the joints. Checking labs today to evaluate for RA. Repeat uric acid level pending.

## 2022-07-25 NOTE — Assessment & Plan Note (Signed)
Improving since steroid injections. Following with orthopedics.  Encourage continued weight loss and regular exercise.

## 2022-07-25 NOTE — Patient Instructions (Signed)
Stop by the lab prior to leaving today. I will notify you of your results once received.   It was a pleasure to see you today!  Preventive Care 40-46 Years Old, Male Preventive care refers to lifestyle choices and visits with your health care provider that can promote health and wellness. Preventive care visits are also called wellness exams. What can I expect for my preventive care visit? Counseling During your preventive care visit, your health care provider may ask about your: Medical history, including: Past medical problems. Family medical history. Current health, including: Emotional well-being. Home life and relationship well-being. Sexual activity. Lifestyle, including: Alcohol, nicotine or tobacco, and drug use. Access to firearms. Diet, exercise, and sleep habits. Safety issues such as seatbelt and bike helmet use. Sunscreen use. Work and work environment. Physical exam Your health care provider will check your: Height and weight. These may be used to calculate your BMI (body mass index). BMI is a measurement that tells if you are at a healthy weight. Waist circumference. This measures the distance around your waistline. This measurement also tells if you are at a healthy weight and may help predict your risk of certain diseases, such as type 2 diabetes and high blood pressure. Heart rate and blood pressure. Body temperature. Skin for abnormal spots. What immunizations do I need?  Vaccines are usually given at various ages, according to a schedule. Your health care provider will recommend vaccines for you based on your age, medical history, and lifestyle or other factors, such as travel or where you work. What tests do I need? Screening Your health care provider may recommend screening tests for certain conditions. This may include: Lipid and cholesterol levels. Diabetes screening. This is done by checking your blood sugar (glucose) after you have not eaten for a while  (fasting). Hepatitis B test. Hepatitis C test. HIV (human immunodeficiency virus) test. STI (sexually transmitted infection) testing, if you are at risk. Lung cancer screening. Prostate cancer screening. Colorectal cancer screening. Talk with your health care provider about your test results, treatment options, and if necessary, the need for more tests. Follow these instructions at home: Eating and drinking  Eat a diet that includes fresh fruits and vegetables, whole grains, lean protein, and low-fat dairy products. Take vitamin and mineral supplements as recommended by your health care provider. Do not drink alcohol if your health care provider tells you not to drink. If you drink alcohol: Limit how much you have to 0-2 drinks a day. Know how much alcohol is in your drink. In the [place] one drink equals one 12 oz bottle of beer (355 mL), one 5 oz glass of wine (148 mL), or one 1 oz glass of hard liquor (44 mL). Lifestyle Brush your teeth every morning and night with fluoride toothpaste. Floss one time each day. Exercise for at least 30 minutes 5 or more days each week. Do not use any products that contain nicotine or tobacco. These products include cigarettes, chewing tobacco, and vaping devices, such as e-cigarettes. If you need help quitting, ask your health care provider. Do not use drugs. If you are sexually active, practice safe sex. Use a condom or other form of protection to prevent STIs. Take aspirin only as told by your health care provider. Make sure that you understand how much to take and what form to take. Work with your health care provider to find out whether it is safe and beneficial for you to take aspirin daily. Find healthy ways to manage   stress, such as: Meditation, yoga, or listening to music. Journaling. Talking to a trusted person. Spending time with friends and family. Minimize exposure to UV radiation to reduce your risk of skin cancer. Safety Always wear  your seat belt while driving or riding in a vehicle. Do not drive: If you have been drinking alcohol. Do not ride with someone who has been drinking. When you are tired or distracted. While texting. If you have been using any mind-altering substances or drugs. Wear a helmet and other protective equipment during sports activities. If you have firearms in your house, make sure you follow all gun safety procedures. What's next? Go to your health care provider once a year for an annual wellness visit. Ask your health care provider how often you should have your eyes and teeth checked. Stay up to date on all vaccines. This information is not intended to replace advice given to you by your health care provider. Make sure you discuss any questions you have with your health care provider. Document Revised: 04/12/2021 Document Reviewed: 04/12/2021 Elsevier Patient Education  2023 Elsevier Inc.  

## 2022-07-25 NOTE — Progress Notes (Unsigned)
Joseph Ferguson [facility] [address] [place] Phone: [phone] Subjective:   Joseph Ferguson, am serving as a scribe for Dr. Hulan Ferguson.  I'm seeing this patient by the request  of:  Joseph Koch, NP  CC: back pain   [id number]  08/22/2021 Right lumbar radiculopathy.  Is concerned with patient having worsening pain with flexion and extension.  Could have some potential instability with radicular symptoms in the L5 and S1 distribution.  Patient's pain seems to be worsening as well.  We will get x-rays and secondary to the weakness noted with the plantarflexion of the foot I do feel that the possibility of an MRI would be beneficial as well.  Patient given a low dose of prednisone as well as the gabapentin.  We will see how patient responds.  Worsening symptoms I do feel that even further work-up may be necessary in the femur but I think this is highly unlikely.   Reviewed patient's x-rays of the right hip from 2014 and did have an ossification area.  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  Depending on the x-ray findings may extend the MRI of the lumbar to MRI lumbar and pelvis.  Patient's last uric acid was 7.5 and with patient being on treatment goal would be 6.  We will send message to primary care to see if there is a chance for potentially decreasing the hydrochlorothiazide and possibly increasing the losartan.  We will leave this with primary care to see if that is appropriate though.  Patient's hamstring seems to be improving at this moment the patient is having more signs and symptoms consistent with radicular symptoms.  This could be contributing to more of the increasing the discomfort of the pain.  Patient given prednisone as well as the gabapentin to try and see if this will be beneficial.  Hopefully this will help.  Discussed with patient also had an  elevated ESR that could be contributing to more of the pain.  No masses appreciated at the moment.  Follow-up with me again after the imaging for the back and discuss further evaluation and treatment.  Updated 07/26/2022 Joseph Ferguson Joseph Ferguson is a 46 y.o. male coming in with complaint of back pain was found to have more of a lumbar radiculopathy.  Undergone facet injections on the right side both in December 2022 in February 2023. Recently saw primary care provider who did his labs showing the patient did have an elevation in sedimentation rate (down from a year ago.  Uric acid is improved and down to 5.9 awaiting autoimmune labs. Patient states back hurts mostly after he has done activity. Sometimes the pain starts during activity. No other complaints.   MRI was independently visualized by me again from November 2022 showing moderate bilateral facet arthropathy mostly at L4-L5 and L5-S1 with a synovial cyst at the facet joint on the right side    Past Medical History:  Diagnosis Date   Allergy    Depression    Gout    History of colonic polyps 07/17/2011   Hypertension    Sickle cell trait ([place])    Sleep apnea    wears cpap    Past Surgical History:  Procedure Laterality Date   COLONOSCOPY  2012   POLYPECTOMY     WISDOM TOOTH EXTRACTION     Social History   Socioeconomic History  Marital status: Single    Spouse name: Not on file   Number of children: Not on file   Years of education: Not on file   Highest education level: Not on file  Occupational History   Not on file  Tobacco Use   Smoking status: Never   Smokeless tobacco: Never  Substance and Sexual Activity   Alcohol use: Yes    Alcohol/week: 0.0 standard drinks of alcohol    Comment: rarely   Drug use: No   Sexual activity: Yes    Birth control/protection: None  Other Topics Concern   Not on file  Social History Narrative   Divorced.   2 children.   CRNA, works for [employer].    Enjoys spending time with children,  relaxing.    Social Determinants of Health   Financial Resource Strain: Not on file  Food Insecurity: Not on file  Transportation Needs: Not on file  Physical Activity: Not on file  Stress: Not on file  Social Connections: Not on file   Allergies  Allergen Reactions   Shellfish Allergy Swelling   Family History  Problem Relation Age of Onset   Colon cancer Maternal Grandfather 9   Hypertension Father    Colon polyps Neg Hx    Esophageal cancer Neg Hx    Rectal cancer Neg Hx    Stomach cancer Neg Hx      Current Outpatient Medications (Cardiovascular):    amLODipine (NORVASC) 10 MG tablet, TAKE 1 TABLET BY MOUTH DAILY FOR BLOOD PRESSURE   hydrochlorothiazide (HYDRODIURIL) 25 MG tablet, Take 1 tablet (25 mg total) by mouth daily for blood pressure.   losartan (COZAAR) 50 MG tablet, Take 1 tablet (50 mg total) by mouth daily. for blood pressure. Office visit required for further refills.   Current Outpatient Medications (Analgesics):    allopurinol (ZYLOPRIM) 300 MG tablet, Take 1 tablet (300 mg total) by mouth daily. For gout prevention. Office visit required for further refills.   Current Outpatient Medications (Other):    Multiple Vitamin (MULTIVITAMIN PO), Take by mouth daily. [employer] Men's multivitamin   venlafaxine XR (EFFEXOR-XR) 37.5 MG 24 hr capsule, Take 1 capsule (37.5 mg total) by mouth daily with breakfast. For depression. Office visit required for further refills.   Reviewed prior external information including notes and imaging from  primary care provider As well as notes that were available from care everywhere and other healthcare systems.  Past medical history, social, surgical and family history all reviewed in electronic medical record.  No pertanent information unless stated regarding to the chief complaint.   Review of Systems:  No headache, visual changes, nausea, vomiting, diarrhea, constipation, dizziness, abdominal pain, skin rash, fevers, chills,  night sweats, weight loss, swollen lymph nodes, body aches, joint swelling, chest pain, shortness of breath, mood changes. POSITIVE muscle aches  Objective  Blood pressure 108/78, pulse 68, height 5' 10"  (1.778 m), weight 262 lb (118.8 kg), SpO2 98 %.   General: No apparent distress alert and oriented x3 mood and affect normal, dressed appropriately.  HEENT: Pupils equal, extraocular movements intact  Respiratory: Patient's speak in full sentences and does not appear short of breath  Cardiovascular: No lower extremity edema, non tender, no erythema   Back exam still shows the patient is tender to palpation mostly from the L4-L5 bilaterally but does seem to be worse right greater than left.  Seems to be worse with extension and flexion.  Patient does have some tightness with straight leg test though  on the right.  Compared to the left.  Does have a lipoma over the L4-L5 area on the left  Osteopathic findings  L3 flexed rotated and side bent right L4 flexed rotated and side bent left Sacrum right on right     Impression and Recommendations:     The above documentation has been reviewed and is accurate and complete Lyndal Pulley, DO

## 2022-07-25 NOTE — Assessment & Plan Note (Addendum)
Immunizations UTD. Influenza vaccine provided today. Colonoscopy up-to-date, due 2026.  Discussed the importance of a healthy diet and regular exercise in order for weight loss, and to reduce the risk of further co-morbidity. Commended him on a healthy lifestyle.  Exam stable. Labs pending including STD testing per patient request.  Follow up in 1 year for repeat physical.

## 2022-07-25 NOTE — Progress Notes (Signed)
Subjective:    Patient ID: Joseph Ferguson, male    DOB: 1976-09-01, 46 y.o.   MRN: [id number]  HPI  Joseph Ferguson is a very pleasant 46 y.o. male who presents today for complete physical and follow up of chronic conditions.  Immunizations: -Tetanus: 2022 -Influenza: Due today  Diet: Fair diet.  Exercise: Regular exercise with cycling   Eye exam: Completes annually  Dental exam: Completes semi-annually   Colonoscopy: Completed in 2021, due 2026  BP Readings from Last 3 Encounters:  07/25/22 102/68  12/05/21 (!) 137/92  10/05/21 (!) 134/105   Wt Readings from Last 3 Encounters:  07/25/22 259 lb (117.5 kg)  08/22/21 270 lb (122.5 kg)  07/27/21 267 lb (121.1 kg)         Review of Systems  Constitutional:  Negative for unexpected weight change.  HENT:  Negative for rhinorrhea.   Respiratory:  Negative for cough and shortness of breath.   Cardiovascular:  Negative for chest pain.  Gastrointestinal:  Negative for constipation and diarrhea.  Genitourinary:  Negative for difficulty urinating.  Musculoskeletal:  Positive for arthralgias.       [name] noticed swelling to the left 3rd hand digit x 1 month in between the DIP and PIP joints. [name] been cycling recently, otherwise no other repetitive movement. He denies joint swelling, redness.  Skin:  Negative for rash.  Allergic/Immunologic: Negative for environmental allergies.  Neurological:  Negative for dizziness and headaches.  Psychiatric/Behavioral:  The patient is not nervous/anxious.          Past Medical History:  Diagnosis Date   Allergy    Depression    Gout    History of colonic polyps 07/17/2011   Hypertension    Sickle cell trait (HCC)    Sleep apnea    wears cpap     Social History   Socioeconomic History   Marital status: Single    Spouse name: Not on file   Number of children: Not on file   Years of education: Not on file   Highest education level: Not on file  Occupational History    Not on file  Tobacco Use   Smoking status: Never   Smokeless tobacco: Never  Substance and Sexual Activity   Alcohol use: Yes    Alcohol/week: 0.0 standard drinks of alcohol    Comment: rarely   Drug use: No   Sexual activity: Yes    Birth control/protection: None  Other Topics Concern   Not on file  Social History Narrative   Divorced.   2 children.   CRNA, works for [employer].    Enjoys spending time with children, relaxing.    Social Determinants of Health   Financial Resource Strain: Not on file  Food Insecurity: Not on file  Transportation Needs: Not on file  Physical Activity: Not on file  Stress: Not on file  Social Connections: Not on file  Intimate Partner Violence: Not on file    Past Surgical History:  Procedure Laterality Date   COLONOSCOPY  2012   POLYPECTOMY     WISDOM TOOTH EXTRACTION      Family History  Problem Relation Age of Onset   Colon cancer Maternal Grandfather 34   Hypertension Father    Colon polyps Neg Hx    Esophageal cancer Neg Hx    Rectal cancer Neg Hx    Stomach cancer Neg Hx     Allergies  Allergen Reactions   Shellfish Allergy Swelling  Current Outpatient Medications on File Prior to Visit  Medication Sig Dispense Refill   allopurinol (ZYLOPRIM) 300 MG tablet Take 1 tablet (300 mg total) by mouth daily. For gout prevention. Office visit required for further refills. 90 tablet 0   amLODipine (NORVASC) 10 MG tablet TAKE 1 TABLET BY MOUTH DAILY FOR BLOOD PRESSURE 90 tablet 2   hydrochlorothiazide (HYDRODIURIL) 25 MG tablet Take 1 tablet (25 mg total) by mouth daily for blood pressure. 90 tablet 2   losartan (COZAAR) 50 MG tablet Take 1 tablet (50 mg total) by mouth daily. for blood pressure. Office visit required for further refills. 90 tablet 0   Multiple Vitamin (MULTIVITAMIN PO) Take by mouth daily. [employer] Men's multivitamin     venlafaxine XR (EFFEXOR-XR) 37.5 MG 24 hr capsule Take 1 capsule (37.5 mg total) by mouth daily with  breakfast. For depression. Office visit required for further refills. 90 capsule 0   No current facility-administered medications on file prior to visit.    BP 102/68   Pulse 60   Temp (!) 97.1 F (36.2 C) (Temporal)   Ht '5\' 10"'$  (1.778 m)   Wt 259 lb (117.5 kg)   SpO2 99%   BMI 37.16 kg/m  Objective:   Physical Exam HENT:     Right Ear: Tympanic membrane and ear canal normal.     Left Ear: Tympanic membrane and ear canal normal.     Nose: Nose normal.     Right Sinus: No maxillary sinus tenderness or frontal sinus tenderness.     Left Sinus: No maxillary sinus tenderness or frontal sinus tenderness.  Eyes:     Conjunctiva/sclera: Conjunctivae normal.  Neck:     Thyroid: No thyromegaly.     Vascular: No carotid bruit.  Cardiovascular:     Rate and Rhythm: Normal rate and regular rhythm.     Heart sounds: Normal heart sounds.  Pulmonary:     Effort: Pulmonary effort is normal.     Breath sounds: Normal breath sounds. No wheezing or rales.  Abdominal:     General: Bowel sounds are normal.     Palpations: Abdomen is soft.     Tenderness: There is no abdominal tenderness.  Musculoskeletal:        General: Normal range of motion.     Cervical back: Neck supple.     Comments: Mild swelling noted in between DIP and PIP joints on left 3rd hand digit. No decrease in ROM, swelling, or erythema to left DIP or PIP joints.   Skin:    General: Skin is warm and dry.  Neurological:     Mental Status: He is alert and oriented to person, place, and time.     Cranial Nerves: No cranial nerve deficit.     Deep Tendon Reflexes: Reflexes are normal and symmetric.  Psychiatric:        Mood and Affect: Mood normal.           Assessment & Plan:   Problem List Items Addressed This Visit       Cardiovascular and Mediastinum   Essential hypertension    Controlled, borderline to low. He is asymptomatic so I have asked for him to monitor his blood pressure as he works on weight  loss. Consider reduction of one of his blood pressure medications.  Continue losartan 50 mg daily, hydrochlorothiazide 12 mg daily, amlodipine 10 mg daily. CMP pending.      Relevant Orders   Comprehensive metabolic panel   CBC  Nervous and Auditory   Right lumbar radiculopathy    Improving since steroid injections. Following with orthopedics.  Encourage continued weight loss and regular exercise.        Musculoskeletal and Integument   Chronic gout involving toe without tophus    Repeat uric acid level pending. Continue allopurinol 300 mg daily for prevention.      Relevant Orders   Uric acid     Other   Recurrent major depressive disorder, in full remission ([place])    Controlled.  Continue venlafaxine ER 37.5 mg daily.      Preventative health care - Primary    Immunizations UTD. Influenza vaccine provided today. Colonoscopy up-to-date, due 2026.  Discussed the importance of a healthy diet and regular exercise in order for weight loss, and to reduce the risk of further co-morbidity. Commended him on a healthy lifestyle.  Exam stable. Labs pending including STD testing per patient request.  Follow up in 1 year for repeat physical.       Prediabetes    Commended him on weight loss through healthy diet regular exercise. Repeat A1c pending.      Relevant Orders   Hemoglobin A1c   Hyperlipidemia    Commended him on weight loss through regular exercise and a healthy diet. Repeat lipid panel pending.      Relevant Orders   Lipid panel   Localized swelling of finger of left hand    Unclear etiology as this does not appear to be affecting the joints. Checking labs today to evaluate for RA. Repeat uric acid level pending.      Relevant Orders   C-reactive protein   Sedimentation rate   Cyclic citrul peptide antibody, IgG   Rheumatoid factor   Other Visit Diagnoses     Screening for STD (sexually transmitted disease)       Relevant Orders   RPR    Hepatitis C antibody   HIV Antibody (routine testing w rflx)   C. trachomatis/N. gonorrhoeae RNA   Trichomonas vaginalis, RNA   Need for immunization against influenza       Relevant Orders   Flu Vaccine QUAD 67moIM (Fluarix, Fluzone & Alfiuria Quad PF) (Completed)          KPleas Koch NP

## 2022-07-25 NOTE — Assessment & Plan Note (Signed)
Controlled.  Continue venlafaxine ER 37.5 mg daily.

## 2022-07-25 NOTE — Assessment & Plan Note (Signed)
Controlled, borderline to low. He is asymptomatic so I have asked for him to monitor his blood pressure as he works on weight loss. Consider reduction of one of his blood pressure medications.  Continue losartan 50 mg daily, hydrochlorothiazide 12 mg daily, amlodipine 10 mg daily. CMP pending.

## 2022-07-26 ENCOUNTER — Ambulatory Visit (INDEPENDENT_AMBULATORY_CARE_PROVIDER_SITE_OTHER): Payer: 59 | Admitting: Family Medicine

## 2022-07-26 VITALS — BP 108/78 | HR 68 | Ht 70.0 in | Wt 262.0 lb

## 2022-07-26 DIAGNOSIS — M9904 Segmental and somatic dysfunction of sacral region: Secondary | ICD-10-CM | POA: Diagnosis not present

## 2022-07-26 DIAGNOSIS — M9903 Segmental and somatic dysfunction of lumbar region: Secondary | ICD-10-CM | POA: Diagnosis not present

## 2022-07-26 DIAGNOSIS — M5416 Radiculopathy, lumbar region: Secondary | ICD-10-CM

## 2022-07-26 NOTE — Assessment & Plan Note (Signed)
   Decision today to treat with OMT was based on Physical Exam  After verbal consent patient was treated with HVLA, ME, FPR techniques in  lumbar and sacral areas, all areas are chronic   Patient tolerated the procedure well with improvement in symptoms  Patient given exercises, stretches and lifestyle modifications  See medications in patient instructions if given  Patient will follow up in 4-8 weeks 

## 2022-07-26 NOTE — Patient Instructions (Addendum)
[place] [phone] Call Today  Good to see you! Tried manipulation as well, follow up if you would like to continue

## 2022-07-26 NOTE — Assessment & Plan Note (Signed)
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  Did respond extremely well to the facet injections previously and we will repeat another one at this time.  We discussed different medications but patient is no longer taking gabapentin.  He is on a low-dose of Effexor that could be helpful.  Attempted osteopathic manipulation which I am hoping will be helpful as well.  Continue to be active but avoid repetitive extension of the back.  Follow-up again in 6 to 8 weeks

## 2022-07-28 LAB — RHEUMATOID FACTOR: Rheumatoid fact SerPl-aCnc: <14 IU/mL (ref <14)

## 2022-07-28 LAB — TRICHOMONAS VAGINALIS, PROBE AMP: Trichomonas vaginalis RNA: NOT DETECTED

## 2022-07-28 LAB — RPR: RPR Ser Ql: NONREACTIVE

## 2022-07-28 LAB — HEPATITIS C ANTIBODY: Hepatitis C Ab: NONREACTIVE

## 2022-07-28 LAB — HIV ANTIBODY (ROUTINE TESTING W REFLEX): HIV 1&2 Ab, 4th Generation: NONREACTIVE

## 2022-07-28 LAB — C. TRACHOMATIS/N. GONORRHOEAE RNA
C. trachomatis RNA, TMA: NOT DETECTED
N. gonorrhoeae RNA, TMA: NOT DETECTED

## 2022-07-28 LAB — CYCLIC CITRUL PEPTIDE ANTIBODY, IGG: Cyclic Citrullin Peptide Ab: <16 UNITS

## 2022-08-03 ENCOUNTER — Ambulatory Visit
Admission: RE | Admit: 2022-08-03 | Discharge: 2022-08-03 | Disposition: A | Discharge status: Home / Self Care | Payer: 59 | Source: Ambulatory Visit | Attending: Family Medicine | Admitting: Family Medicine

## 2022-08-03 DIAGNOSIS — M545 Low back pain, unspecified: Secondary | ICD-10-CM | POA: Diagnosis not present

## 2022-08-03 DIAGNOSIS — M5416 Radiculopathy, lumbar region: Secondary | ICD-10-CM

## 2022-08-03 MED ORDER — IOPAMIDOL (ISOVUE-M 200) INJECTION 41%
1.0000 mL | Freq: Once | INTRAMUSCULAR | Status: AC
  Administered 2022-08-03: 1 mL via EPIDURAL

## 2022-08-03 MED ORDER — METHYLPREDNISOLONE ACETATE 40 MG/ML INJ SUSP (RADIOLOG
80.0000 mg | Freq: Once | INTRAMUSCULAR | Status: AC
  Administered 2022-08-03: 80 mg via EPIDURAL

## 2022-08-03 NOTE — Discharge Instructions (Signed)

## 2022-09-19 ENCOUNTER — Other Ambulatory Visit: Payer: Self-pay | Admitting: Primary Care

## 2022-09-19 ENCOUNTER — Other Ambulatory Visit (HOSPITAL_COMMUNITY): Payer: Self-pay

## 2022-09-19 DIAGNOSIS — I10 Essential (primary) hypertension: Secondary | ICD-10-CM

## 2022-09-19 MED ORDER — HYDROCHLOROTHIAZIDE 25 MG PO TABS
25.0000 mg | ORAL_TABLET | Freq: Every day | ORAL | 2 refills | Status: DC
  Filled 2022-09-19: qty 90, 90d supply, fill #0
  Filled 2022-10-24 – 2022-12-19 (×2): qty 90, 90d supply, fill #1
  Filled 2023-03-21: qty 90, 90d supply, fill #2

## 2022-09-19 MED ORDER — AMLODIPINE BESYLATE 10 MG PO TABS
10.0000 mg | ORAL_TABLET | Freq: Every day | ORAL | 2 refills | Status: DC
  Filled 2022-09-19: qty 90, 90d supply, fill #0
  Filled 2022-10-24 – 2022-12-19 (×2): qty 90, 90d supply, fill #1
  Filled 2023-03-21: qty 90, 90d supply, fill #2

## 2022-09-21 ENCOUNTER — Other Ambulatory Visit (HOSPITAL_COMMUNITY): Payer: Self-pay

## 2022-10-24 ENCOUNTER — Other Ambulatory Visit (HOSPITAL_COMMUNITY): Payer: Self-pay

## 2022-10-24 ENCOUNTER — Other Ambulatory Visit: Payer: Self-pay | Admitting: Primary Care

## 2022-10-24 DIAGNOSIS — F3342 Major depressive disorder, recurrent, in full remission: Secondary | ICD-10-CM

## 2022-10-24 DIAGNOSIS — I10 Essential (primary) hypertension: Secondary | ICD-10-CM

## 2022-10-24 DIAGNOSIS — M1A9XX0 Chronic gout, unspecified, without tophus (tophi): Secondary | ICD-10-CM

## 2022-10-24 MED ORDER — VENLAFAXINE HCL ER 37.5 MG PO CP24
37.5000 mg | ORAL_CAPSULE | Freq: Every day | ORAL | 2 refills | Status: DC
Start: 2022-10-24 — End: 2023-07-23
  Filled 2022-10-24: qty 90, 90d supply, fill #0
  Filled 2022-11-28 – 2023-02-01 (×2): qty 90, 90d supply, fill #1
  Filled 2023-05-03: qty 90, 90d supply, fill #2

## 2022-10-24 MED ORDER — ALLOPURINOL 300 MG PO TABS
300.0000 mg | ORAL_TABLET | Freq: Every day | ORAL | 2 refills | Status: DC
  Filled 2022-10-24: qty 90, 90d supply, fill #0
  Filled 2023-02-01: qty 90, 90d supply, fill #1
  Filled 2023-05-03: qty 90, 90d supply, fill #2

## 2022-10-24 MED ORDER — LOSARTAN POTASSIUM 50 MG PO TABS
50.0000 mg | ORAL_TABLET | Freq: Every day | ORAL | 2 refills | Status: DC
  Filled 2022-10-24: qty 90, 90d supply, fill #0
  Filled 2023-02-01: qty 90, 90d supply, fill #1
  Filled 2023-05-03: qty 90, 90d supply, fill #2

## 2022-11-01 ENCOUNTER — Ambulatory Visit (INDEPENDENT_AMBULATORY_CARE_PROVIDER_SITE_OTHER): Payer: 59 | Admitting: Primary Care

## 2022-11-01 ENCOUNTER — Encounter: Payer: Self-pay | Admitting: Primary Care

## 2022-11-01 ENCOUNTER — Other Ambulatory Visit (HOSPITAL_COMMUNITY): Payer: Self-pay

## 2022-11-01 VITALS — BP 130/84 | HR 65 | Temp 97.9°F | Ht 70.0 in | Wt 269.0 lb

## 2022-11-01 DIAGNOSIS — E6609 Other obesity due to excess calories: Secondary | ICD-10-CM | POA: Insufficient documentation

## 2022-11-01 DIAGNOSIS — Z6838 Body mass index (BMI) 38.0-38.9, adult: Secondary | ICD-10-CM

## 2022-11-01 MED ORDER — ZEPBOUND 2.5 MG/0.5ML ~~LOC~~ SOAJ
2.5000 mg | SUBCUTANEOUS | 0 refills | Status: DC
  Filled 2022-11-01 – 2023-01-10 (×3): qty 2, 28d supply, fill #0

## 2022-11-01 NOTE — Patient Instructions (Signed)
Start tirzepitide (Zepbound) for weight loss. Start by injecting 2.5 mg into the skin once weekly for 4 weeks, then increase to 5 mg once weekly thereafter. Please notify me once you've used your last 2.5 mg pen so that I can prescribe the next dose.   Please schedule a follow up visit for 2-3 months.  It was a pleasure to see you today!

## 2022-11-01 NOTE — Progress Notes (Signed)
Subjective:    Patient ID: Joseph Ferguson, male    DOB: September 21, 1976, 47 y.o.   MRN: [id number]  HPI  Joseph Ferguson is a very pleasant 47 y.o. male with a history of hypertension, prediabetes, obesity, hyperlipidemia  who presents today to discuss weight loss treatment.  Chronic history of obesity for about 25 years. Weight has fluctuated between 10 pounds over the last several years, has a hard time keeping it off. He attributes his obesity to stress from work, has a difficult time with cutting out sugar due to cravings. [name] tried low carb diets, calorie counting, high protein diets which have been effective temporarily.   Diet currently consists of:  Breakfast: Skips, chicken/beef, vegetable, rice Lunch: protein shake, sandwich (purchased) Dinner: Skips mostly, sometimes [place] bacon, eggs, waffle  Snacks: Infrequent  Desserts: 3-4 times weekly  Beverages: Mostly water, protein shake, Gatorade Zero  Exercise: 30 min daily 4-5 times weekly.   Wt Readings from Last 3 Encounters:  11/01/22 269 lb (122 kg)  07/26/22 262 lb (118.8 kg)  07/25/22 259 lb (117.5 kg)     BP Readings from Last 3 Encounters:  11/01/22 130/84  08/03/22 (!) 122/91  07/26/22 108/78     Body mass index is 38.6 kg/m.   Review of Systems  Respiratory:  Negative for shortness of breath.   Cardiovascular:  Negative for chest pain.  Neurological:  Negative for dizziness.         Past Medical History:  Diagnosis Date   Allergy    Depression    Gout    History of colonic polyps 07/17/2011   Hypertension    Sickle cell trait (HCC)    Sleep apnea    wears cpap     Social History   Socioeconomic History   Marital status: Single    Spouse name: Not on file   Number of children: Not on file   Years of education: Not on file   Highest education level: Not on file  Occupational History   Not on file  Tobacco Use   Smoking status: Never   Smokeless tobacco: Never  Substance and  Sexual Activity   Alcohol use: Yes    Alcohol/week: 0.0 standard drinks of alcohol    Comment: rarely   Drug use: No   Sexual activity: Yes    Birth control/protection: None  Other Topics Concern   Not on file  Social History Narrative   Divorced.   2 children.   CRNA, works for [employer].    Enjoys spending time with children, relaxing.    Social Determinants of Health   Financial Resource Strain: Not on file  Food Insecurity: Not on file  Transportation Needs: Not on file  Physical Activity: Not on file  Stress: Not on file  Social Connections: Not on file  Intimate Partner Violence: Not on file    Past Surgical History:  Procedure Laterality Date   COLONOSCOPY  2012   POLYPECTOMY     WISDOM TOOTH EXTRACTION      Family History  Problem Relation Age of Onset   Colon cancer Maternal Grandfather 47   Hypertension Father    Colon polyps Neg Hx    Esophageal cancer Neg Hx    Rectal cancer Neg Hx    Stomach cancer Neg Hx     Allergies  Allergen Reactions   Shellfish Allergy Swelling    Current Outpatient Medications on File Prior to Visit  Medication Sig Dispense Refill  allopurinol (ZYLOPRIM) 300 MG tablet Take 1 tablet (300 mg total) by mouth daily. For gout prevention. 90 tablet 2   amLODipine (NORVASC) 10 MG tablet Take 1 tablet (10 mg total) by mouth daily for blood pressure 90 tablet 2   hydrochlorothiazide (HYDRODIURIL) 25 MG tablet Take 1 tablet (25 mg total) by mouth daily for blood pressure. 90 tablet 2   losartan (COZAAR) 50 MG tablet Take 1 tablet (50 mg total) by mouth daily. for blood pressure. 90 tablet 2   Multiple Vitamin (MULTIVITAMIN PO) Take by mouth daily. [employer] Men's multivitamin     venlafaxine XR (EFFEXOR-XR) 37.5 MG 24 hr capsule Take 1 capsule (37.5 mg total) by mouth daily with breakfast. For depression. 90 capsule 2   No current facility-administered medications on file prior to visit.    BP 130/84   Pulse 65   Temp 97.9 F (36.6 C)  (Temporal)   Ht '5\' 10"'$  (1.778 m)   Wt 269 lb (122 kg)   SpO2 100%   BMI 38.60 kg/m  Objective:   Physical Exam Cardiovascular:     Rate and Rhythm: Normal rate and regular rhythm.  Pulmonary:     Effort: Pulmonary effort is normal.     Breath sounds: Normal breath sounds. No wheezing or rales.  Musculoskeletal:     Cervical back: Neck supple.  Skin:    General: Skin is warm and dry.  Neurological:     Mental Status: He is alert and oriented to person, place, and time.           Assessment & Plan:   Problem List Items Addressed This Visit       Other   Class 2 obesity due to excess calories with body mass index (BMI) of 38.0 to 38.9 in adult - Primary    With several co-morbidities. Discussed options for treatment, he is interested in GLP 1 agonist.  Rx for Zepbound 2.5 mg sent to pharmacy. Will start 2.5 mg weekly x 4 weeks, then increase to 5 mg thereafter.   Discussed to continue to work on diet. Continue regular exercise.  Follow up in 2-3 months.      Relevant Medications   tirzepatide (ZEPBOUND) 2.5 MG/0.5ML Pen       Pleas Koch, NP

## 2022-11-01 NOTE — Assessment & Plan Note (Signed)
With several co-morbidities. Discussed options for treatment, he is interested in GLP 1 agonist.  Rx for Zepbound 2.5 mg sent to pharmacy. Will start 2.5 mg weekly x 4 weeks, then increase to 5 mg thereafter.   Discussed to continue to work on diet. Continue regular exercise.  Follow up in 2-3 months.

## 2022-11-12 ENCOUNTER — Other Ambulatory Visit (HOSPITAL_COMMUNITY): Payer: Self-pay

## 2022-11-12 NOTE — Telephone Encounter (Signed)
A PA needs to be completed for this patients Zepbound. [facility] pharmacy states that has bene sent to be completed.

## 2022-11-12 NOTE — Telephone Encounter (Signed)
Patient Advocate Encounter   Received notification from [employer] that prior authorization for Zepbound 2.'5MG'$ /0.5ML is required.   PA submitted on 11/12/2022 Key [id number] Status is pending

## 2022-11-15 ENCOUNTER — Other Ambulatory Visit (HOSPITAL_COMMUNITY): Payer: Self-pay

## 2022-11-15 NOTE — Telephone Encounter (Signed)
Pharmacy Patient Advocate Encounter  Received notification from [employer] that the request for prior authorization for Zepbound 2.'5mg'$ /0.81m has been denied due to .    You may call [phone] fax [phone] to appeal. E APPEAL AVAILABLE ([id number]  Please be advised we currently do not have a Pharmacist to review denials. If you would like [employer] submit it on your behalf, please provide clinical information to support your reason for appeal and any pertinent information you would like [employer] include with the appeal request. Appeals may take longer 5 business days to be submitted as we prepares necessary documentation. Thanks for your support.  How would you like to proceed?

## 2022-11-16 ENCOUNTER — Other Ambulatory Visit (HOSPITAL_COMMUNITY): Payer: Self-pay

## 2022-11-23 ENCOUNTER — Encounter: Payer: Self-pay | Admitting: Podiatry

## 2022-11-23 ENCOUNTER — Ambulatory Visit (INDEPENDENT_AMBULATORY_CARE_PROVIDER_SITE_OTHER): Payer: 59

## 2022-11-23 ENCOUNTER — Ambulatory Visit (INDEPENDENT_AMBULATORY_CARE_PROVIDER_SITE_OTHER): Payer: 59 | Admitting: Podiatry

## 2022-11-23 DIAGNOSIS — M205X1 Other deformities of toe(s) (acquired), right foot: Secondary | ICD-10-CM

## 2022-11-23 NOTE — Progress Notes (Signed)
   HPI: 47 y.o. male presenting today as a new patient for evaluation of pain and tenderness to the right great toe joint has been present for few years now.  Pain with activity.  Pain with certain shoes as well.  Patient states the pain is slowly increased over the past few years.  He has not done anything for treatment other than shoe gear modifications which did not alleviate any of his pain recently.  He presents for further treatment and evaluation  Past Medical History:  Diagnosis Date   Allergy    Depression    Gout    History of colonic polyps 07/17/2011   Hypertension    Sickle cell trait (HCC)    Sleep apnea    wears cpap      Physical Exam: General: The patient is alert and oriented x3 in no acute distress.  Dermatology: Skin is warm, dry and supple bilateral lower extremities. Negative for open lesions or macerations.  Vascular: Palpable pedal pulses bilaterally. No edema or erythema noted. Capillary refill within normal limits.  Neurological: Epicritic and protective threshold grossly intact bilaterally.   Musculoskeletal Exam: Range of motion within normal limits to all pedal and ankle joints bilateral. Muscle strength 5/5 in all groups bilateral.  Pain on palpation and range of motion to the first MTPJ with palpable dorsal medial spurring of the metatarsal head.  Associated tenderness to palpation  Radiographic Exam:  Normal osseous mineralization.  There are some very mild degenerative changes noted within the first MTPJ of the right foot.  Periarticular spurring noted to the metatarsal head best visualized on lateral and oblique views.  There is a very mild hallux valgus deformity however the majority of the patient's complaint is secondary to the spur formations  Assessment: 1.  Periarticular spurs/hallux limitus right great toe   Plan of Care:  1. Patient evaluated. X-Rays reviewed again today.  2. Today we again discussed the conservative versus surgical  management of the presenting pathology. The patient opts for surgical management. All possible complications and details of the procedure were explained. All patient questions were answered. No guarantees were expressed or implied.  Discussed different options for the patient including simple cheilectomy versus arthrodesis versus arthroplasty with implant.  Risk benefits advantages and disadvantages of each were explained in detail.  The patient is very active and concerned about the rigidness of the great toe after arthrodesis.  He would like to pursue arthroplasty with implant over arthrodesis.  This will allow motion of the MTP joint although I did explain the concern that the implant may wear out with time but he is okay to except this risk. 3. Authorization for surgery was reinitiated today. Surgery will consist of right great toe arthroplasty with implant 4.  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  Pronounced [place]       Edrick Kins, DPM [facility]  Dr. Edrick Kins, DPM    [address], [place] [address]                Office [phone]  Fax [phone]

## 2022-11-28 ENCOUNTER — Other Ambulatory Visit (HOSPITAL_COMMUNITY): Payer: Self-pay

## 2022-11-29 ENCOUNTER — Other Ambulatory Visit (HOSPITAL_COMMUNITY): Payer: Self-pay

## 2022-11-29 ENCOUNTER — Telehealth: Payer: Self-pay | Admitting: Podiatry

## 2022-11-29 NOTE — Telephone Encounter (Signed)
DOS: 12/20/2022  [facility] [employer] Save Effective 10/29/2022  [facility] Bunion Implant [phone])  Deductible: $0 Out-of-Pocket: $4,000 with $3,529.94 remaining CoInsurance: 20%  Prior authorization is not required per [employer], [employer]/film/video.  Call Reference #: [id number]

## 2022-12-05 ENCOUNTER — Ambulatory Visit: Payer: Commercial Managed Care - PPO | Admitting: Podiatry

## 2022-12-06 ENCOUNTER — Encounter: Payer: Self-pay | Admitting: Podiatry

## 2022-12-07 ENCOUNTER — Encounter: Payer: Self-pay | Admitting: Podiatry

## 2022-12-07 NOTE — Telephone Encounter (Signed)
Joseph Ferguson, could we please reach out to [facility] and give him an update on his leave paperwork? Thanks!!!

## 2022-12-13 ENCOUNTER — Other Ambulatory Visit: Payer: Self-pay

## 2022-12-13 ENCOUNTER — Encounter: Payer: Self-pay | Admitting: Family Medicine

## 2022-12-13 DIAGNOSIS — M47816 Spondylosis without myelopathy or radiculopathy, lumbar region: Secondary | ICD-10-CM

## 2022-12-20 ENCOUNTER — Other Ambulatory Visit: Payer: Self-pay | Admitting: Podiatry

## 2022-12-20 ENCOUNTER — Other Ambulatory Visit (HOSPITAL_COMMUNITY): Payer: Self-pay

## 2022-12-20 ENCOUNTER — Encounter: Payer: Self-pay | Admitting: Podiatry

## 2022-12-20 DIAGNOSIS — M205X1 Other deformities of toe(s) (acquired), right foot: Secondary | ICD-10-CM | POA: Diagnosis not present

## 2022-12-20 DIAGNOSIS — M19071 Primary osteoarthritis, right ankle and foot: Secondary | ICD-10-CM | POA: Diagnosis not present

## 2022-12-20 DIAGNOSIS — G8918 Other acute postprocedural pain: Secondary | ICD-10-CM | POA: Diagnosis not present

## 2022-12-20 MED ORDER — OXYCODONE-ACETAMINOPHEN 5-325 MG PO TABS
1.0000 | ORAL_TABLET | ORAL | 0 refills | Status: DC | PRN
Start: 2022-12-20 — End: 2022-12-21
  Filled 2022-12-20: qty 30, 5d supply, fill #0

## 2022-12-20 MED ORDER — IBUPROFEN 800 MG PO TABS
800.0000 mg | ORAL_TABLET | Freq: Three times a day (TID) | ORAL | 1 refills | Status: DC
  Filled 2022-12-20: qty 60, 20d supply, fill #0
  Filled 2023-02-01: qty 60, 20d supply, fill #1

## 2022-12-20 NOTE — Progress Notes (Signed)
PRN postop 

## 2022-12-21 ENCOUNTER — Other Ambulatory Visit (HOSPITAL_COMMUNITY): Payer: Self-pay

## 2022-12-21 ENCOUNTER — Other Ambulatory Visit: Payer: Self-pay | Admitting: Podiatry

## 2022-12-21 MED ORDER — HYDROCODONE-ACETAMINOPHEN 10-325 MG PO TABS
1.0000 | ORAL_TABLET | ORAL | 0 refills | Status: AC | PRN
Start: 2022-12-21 — End: 2022-12-26
  Filled 2022-12-21: qty 30, 5d supply, fill #0

## 2022-12-21 NOTE — Progress Notes (Signed)
PRN postop. N&V with the percocet.   Edrick Kins, DPM [facility]  Dr. Edrick Kins, DPM    [address], [place] [address]                Office [phone]  Fax [phone]

## 2022-12-26 ENCOUNTER — Ambulatory Visit (INDEPENDENT_AMBULATORY_CARE_PROVIDER_SITE_OTHER): Payer: 59

## 2022-12-26 ENCOUNTER — Ambulatory Visit (INDEPENDENT_AMBULATORY_CARE_PROVIDER_SITE_OTHER): Payer: 59 | Admitting: Podiatry

## 2022-12-26 DIAGNOSIS — M205X1 Other deformities of toe(s) (acquired), right foot: Secondary | ICD-10-CM | POA: Diagnosis not present

## 2022-12-26 DIAGNOSIS — Z9889 Other specified postprocedural states: Secondary | ICD-10-CM

## 2022-12-26 NOTE — Progress Notes (Signed)
   Chief Complaint  Patient presents with   Routine Post Op    POV # 1 DOS 12/20/22 --- RIGHT GREAT TOE ARTHROPLASTY WITH IMPLANT, Patient denies any pain, NO N/V/F/C/SOB,     Subjective:  Patient presents today status post right great toe arthroplasty with implant.  DOS: 12/20/2022.  Patient doing well.  Pain tolerable.  He is WBAT in the cam boot without any new complaints at this time.  Dressings are clean dry and intact  Past Medical History:  Diagnosis Date   Allergy    Depression    Gout    History of colonic polyps 07/17/2011   Hypertension    Sickle cell trait ([place])    Sleep apnea    wears cpap     Past Surgical History:  Procedure Laterality Date   COLONOSCOPY  2012   POLYPECTOMY     WISDOM TOOTH EXTRACTION      Allergies  Allergen Reactions   Shellfish Allergy Swelling    Objective/Physical Exam Neurovascular status intact.  Incision well coapted with sutures intact. No sign of infectious process noted. No dehiscence. No active bleeding noted.  Moderate edema noted to the surgical extremity.  Radiographic Exam RT foot 12/26/2022:  Orthopedic Silastic implant with metal grommets and osteotomies sites appear to be stable with routine healing.  Assessment: 1. s/p right great toe arthroplasty with implant. DOS: 12/20/2022   Plan of Care:  1. Patient was evaluated. X-rays reviewed 2.  Dressings changed.  Clean dry and intact x 1 week 3.  Continue WBAT cam boot 4.  Return to clinic 1 week   Edrick Kins, DPM [facility]  Dr. Edrick Kins, DPM    [address], [place] [address]                Office [phone]  Fax [phone]

## 2022-12-27 ENCOUNTER — Other Ambulatory Visit: Payer: Self-pay | Admitting: Podiatry

## 2022-12-27 DIAGNOSIS — M205X1 Other deformities of toe(s) (acquired), right foot: Secondary | ICD-10-CM

## 2022-12-27 DIAGNOSIS — Z9889 Other specified postprocedural states: Secondary | ICD-10-CM

## 2023-01-02 ENCOUNTER — Encounter: Payer: 59 | Admitting: Podiatry

## 2023-01-09 ENCOUNTER — Encounter: Payer: 59 | Admitting: Podiatry

## 2023-01-11 ENCOUNTER — Other Ambulatory Visit (HOSPITAL_COMMUNITY): Payer: Self-pay

## 2023-01-16 ENCOUNTER — Ambulatory Visit (INDEPENDENT_AMBULATORY_CARE_PROVIDER_SITE_OTHER): Payer: 59 | Admitting: Podiatry

## 2023-01-16 ENCOUNTER — Encounter: Payer: 59 | Admitting: Podiatry

## 2023-01-16 ENCOUNTER — Ambulatory Visit (INDEPENDENT_AMBULATORY_CARE_PROVIDER_SITE_OTHER): Payer: 59

## 2023-01-16 DIAGNOSIS — Z9889 Other specified postprocedural states: Secondary | ICD-10-CM

## 2023-01-16 NOTE — Progress Notes (Signed)
   Chief Complaint  Patient presents with   Routine Post Op    POV #3 DOS 12/20/22 --- RIGHT GREAT TOE ARTHROPLASTY WITH IMPLANT, patient denies any pain, noV/F/C/SOB , X-rays done today     Subjective:  Patient presents today status post right great toe arthroplasty with implant.  DOS: 12/20/2022.  Patient doing well.  Pain is minimal.  He continues to compensate during ambulation but overall he states that this is more mental.  He is doing well overall  Past Medical History:  Diagnosis Date   Allergy    Depression    Gout    History of colonic polyps 07/17/2011   Hypertension    Sickle cell trait (HCC)    Sleep apnea    wears cpap     Past Surgical History:  Procedure Laterality Date   COLONOSCOPY  2012   POLYPECTOMY     WISDOM TOOTH EXTRACTION      Allergies  Allergen Reactions   Shellfish Allergy Swelling    Objective/Physical Exam Neurovascular status intact.  Incision nicely healed.  Minimal edema noted.  No pain with range of motion.  Good range of motion of the first MTP  Radiographic Exam RT foot 12/26/2022:  Orthopedic Silastic implant with metal grommets and osteotomies sites appear to be stable with routine healing.  Assessment: 1. s/p right great toe arthroplasty with implant. DOS: 12/20/2022   Plan of Care:  1. Patient was evaluated. X-rays reviewed 2.  Continue WBAT good supportive tennis shoes and sneakers.  Slowly increase activity over the next 2-3 weeks 3.  Recommend range of motion exercises and strengthening exercises.  Heel raises 4.  Return to clinic 3 weeks to discuss return to work.  Patient planning to return to work 02/13/2023   Edrick Kins, DPM [facility]  Dr. Edrick Kins, DPM    [address], [place] [address]                Office [phone]  Fax [phone]

## 2023-01-30 ENCOUNTER — Encounter: Payer: 59 | Admitting: Podiatry

## 2023-01-30 DIAGNOSIS — M7581 Other shoulder lesions, right shoulder: Secondary | ICD-10-CM | POA: Diagnosis not present

## 2023-02-01 MED ORDER — TIRZEPATIDE 5 MG/0.5ML ~~LOC~~ SOAJ: 5.0000 mg | SUBCUTANEOUS | 0 refills | Status: DC

## 2023-02-07 ENCOUNTER — Other Ambulatory Visit (HOSPITAL_COMMUNITY): Payer: Self-pay

## 2023-02-07 ENCOUNTER — Other Ambulatory Visit: Payer: Self-pay

## 2023-02-07 DIAGNOSIS — I10 Essential (primary) hypertension: Secondary | ICD-10-CM

## 2023-02-07 DIAGNOSIS — E785 Hyperlipidemia, unspecified: Secondary | ICD-10-CM

## 2023-02-07 DIAGNOSIS — R7303 Prediabetes: Secondary | ICD-10-CM

## 2023-02-07 MED ORDER — TIRZEPATIDE 5 MG/0.5ML ~~LOC~~ SOAJ
5.0000 mg | SUBCUTANEOUS | 0 refills | Status: DC
  Filled 2023-02-07: qty 2, 28d supply, fill #0

## 2023-02-11 ENCOUNTER — Encounter (INDEPENDENT_AMBULATORY_CARE_PROVIDER_SITE_OTHER): Payer: 59 | Admitting: Podiatry

## 2023-02-11 DIAGNOSIS — Z9889 Other specified postprocedural states: Secondary | ICD-10-CM

## 2023-02-11 NOTE — Progress Notes (Signed)
   Spoke with patient today via telephone regarding return to work status.  Status post right great toe arthroplasty with implant.  DOS: 12/20/2022.  Patient doing well.  No pain.  He is walking and ambulating just fine in tennis shoes with no complaints.  From a surgical standpoint patient is okay to return to work today, 02/11/2023, full activity no restrictions.  Return to clinic as needed  Felecia Shelling, DPM [facility]  Dr. Felecia Shelling, DPM    [address] [place], [place] [address]                Office [phone]  Fax [phone]

## 2023-02-19 ENCOUNTER — Other Ambulatory Visit (HOSPITAL_COMMUNITY): Payer: Self-pay

## 2023-02-19 DIAGNOSIS — J342 Deviated nasal septum: Secondary | ICD-10-CM | POA: Diagnosis not present

## 2023-02-19 DIAGNOSIS — J309 Allergic rhinitis, unspecified: Secondary | ICD-10-CM | POA: Diagnosis not present

## 2023-02-19 DIAGNOSIS — K219 Gastro-esophageal reflux disease without esophagitis: Secondary | ICD-10-CM | POA: Diagnosis not present

## 2023-02-19 DIAGNOSIS — R09A2 Foreign body sensation, throat: Secondary | ICD-10-CM | POA: Diagnosis not present

## 2023-02-19 DIAGNOSIS — H9193 Unspecified hearing loss, bilateral: Secondary | ICD-10-CM | POA: Diagnosis not present

## 2023-02-19 DIAGNOSIS — J343 Hypertrophy of nasal turbinates: Secondary | ICD-10-CM | POA: Diagnosis not present

## 2023-02-19 MED ORDER — AZELASTINE HCL 137 MCG/SPRAY NA SOLN
1.0000 | Freq: Two times a day (BID) | NASAL | 11 refills | Status: AC | PRN
  Filled 2023-02-19 – 2023-06-05 (×2): qty 30, 25d supply, fill #0

## 2023-02-19 MED ORDER — FLUTICASONE PROPIONATE 50 MCG/ACT NA SUSP
1.0000 | Freq: Two times a day (BID) | NASAL | 11 refills | Status: AC
  Filled 2023-02-19 – 2023-03-08 (×2): qty 16, 30d supply, fill #0

## 2023-02-21 ENCOUNTER — Other Ambulatory Visit (HOSPITAL_COMMUNITY): Payer: Self-pay

## 2023-03-04 ENCOUNTER — Other Ambulatory Visit (HOSPITAL_COMMUNITY): Payer: Self-pay

## 2023-03-04 ENCOUNTER — Telehealth: Payer: Self-pay

## 2023-03-04 MED ORDER — ZEPBOUND 7.5 MG/0.5ML ~~LOC~~ SOAJ
7.5000 mg | SUBCUTANEOUS | 0 refills | Status: DC
  Filled 2023-03-04 – 2023-03-08 (×3): qty 2, 28d supply, fill #0

## 2023-03-04 NOTE — Telephone Encounter (Signed)
PA request received via CMM for Mounjaro 5MG /0.5ML pen-injectors  PA has been submitted to [employer] and is pending additional questions/determination  Key: [id number]

## 2023-03-06 NOTE — Telephone Encounter (Signed)
Pharmacy Patient Advocate Encounter  Received notification from [employer] that the request for prior authorization for Greggory Keen has been denied due to the pt not having diabetes. Pt's ins plan only covered GLP-1 agonist medications if the pt has diabetes, it is not covered for weight loss.    Please be advised we currently do not have a Pharmacist to review denials, therefore you will need to process appeals accordingly as needed. Thanks for your support at this time.   You may call [phone] or fax [phone], to appeal. To discuss with a clinical reviewer, call [phone]. Denial letter will be indexed to pt's chart.

## 2023-03-07 ENCOUNTER — Other Ambulatory Visit: Payer: Self-pay

## 2023-03-07 ENCOUNTER — Other Ambulatory Visit (HOSPITAL_COMMUNITY): Payer: Self-pay

## 2023-03-08 ENCOUNTER — Other Ambulatory Visit: Payer: Self-pay

## 2023-03-08 ENCOUNTER — Other Ambulatory Visit (HOSPITAL_COMMUNITY): Payer: Self-pay

## 2023-03-19 ENCOUNTER — Other Ambulatory Visit (HOSPITAL_COMMUNITY): Payer: Self-pay

## 2023-04-07 ENCOUNTER — Other Ambulatory Visit: Payer: Self-pay | Admitting: Primary Care

## 2023-04-07 DIAGNOSIS — E785 Hyperlipidemia, unspecified: Secondary | ICD-10-CM

## 2023-04-07 DIAGNOSIS — I10 Essential (primary) hypertension: Secondary | ICD-10-CM

## 2023-04-07 DIAGNOSIS — R7303 Prediabetes: Secondary | ICD-10-CM

## 2023-04-08 MED ORDER — ZEPBOUND 7.5 MG/0.5ML ~~LOC~~ SOAJ
7.5000 mg | SUBCUTANEOUS | 0 refills | Status: DC
Start: 2023-04-08 — End: 2023-04-24
  Filled 2023-04-08 – 2023-04-13 (×2): qty 2, 28d supply, fill #0

## 2023-04-09 ENCOUNTER — Other Ambulatory Visit (HOSPITAL_COMMUNITY): Payer: Self-pay

## 2023-04-14 ENCOUNTER — Other Ambulatory Visit (HOSPITAL_COMMUNITY): Payer: Self-pay

## 2023-04-15 ENCOUNTER — Other Ambulatory Visit: Payer: Self-pay

## 2023-04-19 ENCOUNTER — Other Ambulatory Visit (HOSPITAL_COMMUNITY): Payer: Self-pay

## 2023-04-24 ENCOUNTER — Other Ambulatory Visit (HOSPITAL_COMMUNITY): Payer: Self-pay

## 2023-04-24 ENCOUNTER — Encounter: Payer: Self-pay | Admitting: Primary Care

## 2023-04-24 ENCOUNTER — Ambulatory Visit: Payer: 59 | Admitting: Primary Care

## 2023-04-24 VITALS — BP 124/62 | HR 75 | Temp 97.2°F | Ht 70.0 in | Wt 246.0 lb

## 2023-04-24 DIAGNOSIS — E785 Hyperlipidemia, unspecified: Secondary | ICD-10-CM | POA: Diagnosis not present

## 2023-04-24 DIAGNOSIS — Z6835 Body mass index (BMI) 35.0-35.9, adult: Secondary | ICD-10-CM

## 2023-04-24 DIAGNOSIS — R7303 Prediabetes: Secondary | ICD-10-CM | POA: Diagnosis not present

## 2023-04-24 DIAGNOSIS — E66812 Obesity, class 2: Secondary | ICD-10-CM

## 2023-04-24 LAB — LIPID PANEL
Cholesterol: 181 mg/dL (ref 0–200)
HDL: 34.7 mg/dL — ABNORMAL LOW (ref >39.00)
LDL Cholesterol: 123 mg/dL — ABNORMAL HIGH (ref 0–99)
NonHDL: 145.84
Total CHOL/HDL Ratio: 5
Triglycerides: 112 mg/dL (ref 0.0–149.0)
VLDL: 22.4 mg/dL (ref 0.0–40.0)

## 2023-04-24 LAB — HEMOGLOBIN A1C: Hgb A1c MFr Bld: 5.6 % (ref 4.6–6.5)

## 2023-04-24 MED ORDER — TIRZEPATIDE-WEIGHT MANAGEMENT 10 MG/0.5ML ~~LOC~~ SOAJ
10.0000 mg | SUBCUTANEOUS | 0 refills | Status: DC
Start: 2023-04-24 — End: 2023-06-12
  Filled 2023-04-24: qty 2, 28d supply, fill #0

## 2023-04-24 NOTE — Patient Instructions (Addendum)
Stop by the lab prior to leaving today. I will notify you of your results once received.   We increased the dose of your Zepbound to 10 mg weekly.  I sent a new prescription to your pharmacy.  Please schedule a physical to meet with me in 3 months.   It was a pleasure to see you today!

## 2023-04-24 NOTE — Progress Notes (Signed)
Subjective:    Patient ID: Joseph Ferguson, male    DOB: Mar 11, 1976, 47 y.o.   MRN: [id number]  HPI  Joseph Ferguson is a very pleasant 46 y.o. male with a history of hypertension, chronic gout, prediabetes, class II obesity who presents today for follow-up of obesity.  Currently managed on Zepbound for weight loss at 7.5 mg weekly for which was initiated at 2.5 mg weekly in January 2024, but he began his regimen in March 2024.  Since his last visit he has lost 23 pounds. He has noticed a reduction in his food cravings. He is mostly eating one large, healthy meal per day.   Diet currently consists of:  Breakfast (2pm) - protein shakes with fruit and veggies or yogurt and fruit, bagel Lunch: (5:30 pm) - chicken breast, veggies, salads with protein Dinner: Skips Snacks: Tuna  Desserts: Occasionally on Sunday Beverages: Water, occasionally diet soda   Exercise: Previously exercising, but has not been exercising over the last 3 months due to a shoulder injury.   He has noticed mild esophageal burning which is tolerable and resolves with PRN tums. He denies nausea, constipation.   Wt Readings from Last 3 Encounters:  04/24/23 246 lb (111.6 kg)  11/01/22 269 lb (122 kg)  07/26/22 262 lb (118.8 kg)   Body mass index is 35.3 kg/m.    Review of Systems  Gastrointestinal:  Negative for abdominal pain, constipation, nausea and vomiting.  Neurological:  Negative for headaches.         Past Medical History:  Diagnosis Date   Allergy    Depression    Gout    History of colonic polyps 07/17/2011   Hypertension    Sickle cell trait (HCC)    Sleep apnea    wears cpap     Social History   Socioeconomic History   Marital status: Single    Spouse name: Not on file   Number of children: Not on file   Years of education: Not on file   Highest education level: Not on file  Occupational History   Not on file  Tobacco Use   Smoking status: Never   Smokeless tobacco:  Never  Substance and Sexual Activity   Alcohol use: Yes    Alcohol/week: 0.0 standard drinks of alcohol    Comment: rarely   Drug use: No   Sexual activity: Yes    Birth control/protection: None  Other Topics Concern   Not on file  Social History Narrative   Divorced.   2 children.   CRNA, works for [employer].    Enjoys spending time with children, relaxing.    Social Determinants of Health   Financial Resource Strain: Not on file  Food Insecurity: Not on file  Transportation Needs: Not on file  Physical Activity: Not on file  Stress: Not on file  Social Connections: Not on file  Intimate Partner Violence: Not on file    Past Surgical History:  Procedure Laterality Date   COLONOSCOPY  2012   POLYPECTOMY     WISDOM TOOTH EXTRACTION      Family History  Problem Relation Age of Onset   Colon cancer Maternal Grandfather 72   Hypertension Father    Colon polyps Neg Hx    Esophageal cancer Neg Hx    Rectal cancer Neg Hx    Stomach cancer Neg Hx     Allergies  Allergen Reactions   Shellfish Allergy Swelling    Current Outpatient Medications on  File Prior to Visit  Medication Sig Dispense Refill   allopurinol (ZYLOPRIM) 300 MG tablet Take 1 tablet (300 mg total) by mouth daily. For gout prevention. 90 tablet 2   amLODipine (NORVASC) 10 MG tablet Take 1 tablet (10 mg total) by mouth daily for blood pressure 90 tablet 2   Azelastine HCl 137 MCG/SPRAY SOLN Place 1-2 sprays into both nostrils 2 (two) times daily as needed. 30 mL 11   fluticasone (FLONASE) 50 MCG/ACT nasal spray Place 1 spray into both nostrils 2 (two) times daily or 2 sprays into each nostril once daily 16 g 11   hydrochlorothiazide (HYDRODIURIL) 25 MG tablet Take 1 tablet (25 mg total) by mouth daily for blood pressure. 90 tablet 2   ibuprofen (ADVIL) 800 MG tablet Take 1 tablet (800 mg total) by mouth 3 (three) times daily. 60 tablet 1   losartan (COZAAR) 50 MG tablet Take 1 tablet (50 mg total) by mouth  daily. for blood pressure. 90 tablet 2   Multiple Vitamin (MULTIVITAMIN PO) Take by mouth daily. [employer] Men's multivitamin     venlafaxine XR (EFFEXOR-XR) 37.5 MG 24 hr capsule Take 1 capsule (37.5 mg total) by mouth daily with breakfast. For depression. 90 capsule 2   No current facility-administered medications on file prior to visit.    BP 124/62   Pulse 75   Temp (!) 97.2 F (36.2 C) (Temporal)   Ht 5\' 10"  (1.778 m)   Wt 246 lb (111.6 kg)   SpO2 99%   BMI 35.30 kg/m  Objective:   Physical Exam Cardiovascular:     Rate and Rhythm: Normal rate and regular rhythm.  Pulmonary:     Effort: Pulmonary effort is normal.     Breath sounds: Normal breath sounds. No wheezing or rales.  Musculoskeletal:     Cervical back: Neck supple.  Skin:    General: Skin is warm and dry.  Neurological:     Mental Status: He is alert and oriented to person, place, and time.           Assessment & Plan:  Class 2 severe obesity due to excess calories with serious comorbidity and body mass index (BMI) of 35.0 to 35.9 in adult [facility]) Assessment & Plan: Commended him on his weight loss, encouraged to continue!  He will resume physical activity shortly.  Increase Zepbound to 10 mg weekly as he is tolerating well. He will update in a few weeks.    Orders: -     Tirzepatide-Weight Management; Inject 10 mg into the skin once a week.  Dispense: 2 mL; Refill: 0  Prediabetes Assessment & Plan: Commended him on weight loss!  Repeat A1c pending.  Orders: -     Hemoglobin A1c  Hyperlipidemia, unspecified hyperlipidemia type Assessment & Plan: Commended him on weight loss!  Repeat lipid panel pending.  Orders: -     Lipid panel        Doreene Nest, NP

## 2023-04-24 NOTE — Assessment & Plan Note (Signed)
Commended him on weight loss!  Repeat A1c pending.

## 2023-04-24 NOTE — Assessment & Plan Note (Signed)
Commended him on weight loss! ? ?Repeat lipid panel pending. ?

## 2023-04-24 NOTE — Assessment & Plan Note (Signed)
Commended him on his weight loss, encouraged to continue!  He will resume physical activity shortly.  Increase Zepbound to 10 mg weekly as he is tolerating well. He will update in a few weeks.

## 2023-05-01 ENCOUNTER — Other Ambulatory Visit (HOSPITAL_COMMUNITY): Payer: Self-pay

## 2023-05-03 ENCOUNTER — Other Ambulatory Visit: Payer: Self-pay

## 2023-06-05 ENCOUNTER — Other Ambulatory Visit (HOSPITAL_COMMUNITY): Payer: Self-pay

## 2023-06-06 ENCOUNTER — Ambulatory Visit (HOSPITAL_BASED_OUTPATIENT_CLINIC_OR_DEPARTMENT_OTHER): Payer: Self-pay | Admitting: Orthopaedic Surgery

## 2023-06-12 ENCOUNTER — Ambulatory Visit (INDEPENDENT_AMBULATORY_CARE_PROVIDER_SITE_OTHER): Payer: Self-pay | Admitting: Orthopedic Surgery

## 2023-06-12 ENCOUNTER — Other Ambulatory Visit (INDEPENDENT_AMBULATORY_CARE_PROVIDER_SITE_OTHER): Payer: Self-pay

## 2023-06-12 ENCOUNTER — Ambulatory Visit: Payer: Self-pay | Admitting: Orthopedic Surgery

## 2023-06-12 ENCOUNTER — Other Ambulatory Visit (HOSPITAL_COMMUNITY): Payer: Self-pay

## 2023-06-12 ENCOUNTER — Other Ambulatory Visit: Payer: Self-pay | Admitting: Primary Care

## 2023-06-12 DIAGNOSIS — M25512 Pain in left shoulder: Secondary | ICD-10-CM

## 2023-06-12 DIAGNOSIS — M25521 Pain in right elbow: Secondary | ICD-10-CM

## 2023-06-12 MED ORDER — ZEPBOUND 10 MG/0.5ML ~~LOC~~ SOAJ
10.0000 mg | SUBCUTANEOUS | 0 refills | Status: DC
Start: 2023-06-12 — End: 2023-08-20
  Filled 2023-06-12 – 2023-07-23 (×4): qty 2, 28d supply, fill #0

## 2023-06-12 NOTE — Progress Notes (Signed)
Office Visit Note   Patient: Joseph Ferguson           Date of Birth: [birth year]           MRN: [id number] Visit Date: 06/12/2023 Requested by: Doreene Nest, NP [address] [place],  [place] [address] PCP: Doreene Nest, NP  Subjective: Chief Complaint  Patient presents with   Right Shoulder - Pain   Left Shoulder - Pain    HPI: Joseph Ferguson is a 47 y.o. male who presents to the office reporting bilateral shoulder pain and right biceps pain.  Pain started in April affecting primarily the right shoulder when he was doing extensive workouts for several days prior to when his pain started.  He was seen at an urgent care where radiographs of the shoulder were normal.  He was given steroids and told not to do any weight lifting for about 3 months.  He was told he had "weightlifter shoulder".  Patient states that Tylenol ibuprofen and ice do help.  Then in early July he was lifting yard umbrellas and felt a pop in the forearm on the right-hand side and is noticed a deformity in the proximal flexor pronator mass since that time.  Initially his arm was weak but it slowly improved.  Now he describes acute left sided shoulder pain which is worse than the right.  That has been going on for minimum 2 weeks but gradually for 6 weeks.  He reports painful range of motion but it is not limiting.  Denies any radicular pain no numbness and tingling does report mild strap muscle neck pain but no scapular pain.  Also has a lot of pain which involves the anterior part of his chest on the left-hand side along the course of the clavicle down to the sternoclavicular joint as well as the [facility] joint.  The pain does wake him from sleep at night on a regular basis.  Hard for him to raise that left arm.  He has not done any working out with the chest or shoulder since April until the end of June..                ROS: All systems reviewed are negative as they relate to the chief complaint within the history  of present illness.  Patient denies fevers or chills.  Assessment & Plan: Visit Diagnoses:  1. Pain in right elbow   2. Left shoulder pain, unspecified chronicity     Plan: Impression is right elbow pain with thickening of the biceps tendon consistent with possible partial rupture versus flexor pronator mass partial rupture.  His supination strength is excellent but he does have pain in the antecubital fossa region.  Also mild pain with pronation more on the right than the left.  Elbow range of motion is intact bilaterally.  Plan at this time is MRI scan right elbow to evaluate partial biceps rupture versus flexor pronator mass disruption with healing.  Patient also is having significant left shoulder pain.  The shoulder pain appears to be primarily anterior and involving the clavicle.  Does have a little bit of AC joint crepitus on the left which is not present on the right.  Rotator cuff strength is intact.  Does not look like radiculopathy from the neck but that is a consideration.  I think it may be coming from the [facility] joint and possibly the sternoclavicular joint.  Plan MRI arthrogram left shoulder to evaluate [facility] joint  as well as clavicle.  Follow-up after that study.  Follow-Up Instructions: No follow-ups on file.   Orders:  Orders Placed This Encounter  Procedures   XR Elbow 2 Views Right   XR Shoulder Left   No orders of the defined types were placed in this encounter.     Procedures: No procedures performed   Clinical Data: No additional findings.  Objective: Vital Signs: There were no vitals taken for this visit.  Physical Exam:  Constitutional: Patient appears well-developed HEENT:  Head: Normocephalic Eyes:EOM are normal Neck: Normal range of motion Cardiovascular: Normal rate Pulmonary/chest: Effort normal Neurologic: Patient is alert Skin: Skin is warm Psychiatric: Patient has normal mood and affect  Ortho Exam: Ortho exam demonstrates good cervical spine  range of motion with flexion chin to chest extension about 30 degrees and rotation 60 degrees bilaterally.  Patient has 5 out of 5 grip EPL FPL interosseous wrist flexion extension bicep triceps and deltoid strength.  No pain with supination on the right or left-hand side.  Biceps tendon is thicker on the right than the left and mildly tender.  He also has slight indentation just distal to the medial epicondyle on the right-hand side.  Finger flexion strength is symmetric bilaterally along with wrist flexion strength.  Pronation slightly tender on the right compared to the left.  Supination nontender and the strength is symmetric on both sides.  Bilateral shoulder exam demonstrates passive range of motion of 60/100/170.  No coarse grinding or crepitus with internal and external rotation of the right-hand side.  Rotator cuff strength is intact infraspinatus supraspinatus and subscap muscle testing on the right.  No pain or clicking with labral load testing on the right.  No discrete AC joint tenderness or pain with crossarm adduction on the right.  On the left-hand side there is a little bit of AC joint crepitus with rotation.  Not too much pain with crossarm adduction.  Labral load testing negative on the left.  Negative O'Brien's on the right equivocal on the left.  Patient has excellent rotator cuff strength on the left infraspinatus supraspinatus and subscap muscle testing.  Motor or sensory function of both hands intact.  Deltoid fires nicely.  Mild tenderness but no deformity to palpation along the left clavicle.  Specialty Comments:  No specialty comments available.  Imaging: XR Shoulder Left  Result Date: 06/12/2023 AP axillary outlet radiographs left shoulder reviewed.  Acromiohumeral distance maintained.  Visualized lung fields intact.  Shoulder is located.  No acute fracture.  No degenerative changes in the glenohumeral joint.  Slight spurring noted on the distal end of the clavicle.  No definitive  osteolysis of the distal end of the clavicle.    PMFS History: Patient Active Problem List   Diagnosis Date Noted   Class 2 obesity due to excess calories with body mass index (BMI) of 35.0 to 35.9 in adult 11/01/2022   Somatic dysfunction of spine, sacral 07/26/2022   Localized swelling of finger of left hand 07/25/2022   Right lumbar radiculopathy 08/22/2021   Hamstring strain, right, initial encounter 07/27/2021   Vitamin D deficiency 06/21/2021   Low testosterone in male 06/21/2021   Prediabetes 10/12/2019   Preventative health care 12/18/2018   Snoring 12/18/2018   Essential hypertension 08/01/2017   Recurrent major depressive disorder, in full remission (HCC) 08/01/2017   Chronic gout involving toe without tophus 08/01/2017   Hyperlipidemia 07/24/2011   Family history of colon cancer 07/17/2011   Migraine 07/17/2011   Obesity (  BMI 30-39.9) 07/17/2011   HEMORRHOIDS 12/11/2007   IRRITABLE BOWEL SYNDROME 12/11/2007   COLONIC POLYPS, ADENOMATOUS 10/14/2007   Past Medical History:  Diagnosis Date   Allergy    Depression    Gout    History of colonic polyps 07/17/2011   Hypertension    Sickle cell trait (HCC)    Sleep apnea    wears cpap     Family History  Problem Relation Age of Onset   Colon cancer Maternal Grandfather 56   Hypertension Father    Colon polyps Neg Hx    Esophageal cancer Neg Hx    Rectal cancer Neg Hx    Stomach cancer Neg Hx     Past Surgical History:  Procedure Laterality Date   COLONOSCOPY  2012   POLYPECTOMY     WISDOM TOOTH EXTRACTION     Social History   Occupational History   Not on file  Tobacco Use   Smoking status: Never   Smokeless tobacco: Never  Substance and Sexual Activity   Alcohol use: Yes    Alcohol/week: 0.0 standard drinks of alcohol    Comment: rarely   Drug use: No   Sexual activity: Yes    Birth control/protection: None

## 2023-06-15 ENCOUNTER — Encounter: Payer: Self-pay | Admitting: Orthopedic Surgery

## 2023-06-20 ENCOUNTER — Other Ambulatory Visit: Payer: Self-pay | Admitting: Primary Care

## 2023-06-20 ENCOUNTER — Other Ambulatory Visit (HOSPITAL_COMMUNITY): Payer: Self-pay

## 2023-06-20 DIAGNOSIS — I10 Essential (primary) hypertension: Secondary | ICD-10-CM

## 2023-06-20 MED ORDER — AMLODIPINE BESYLATE 10 MG PO TABS
10.0000 mg | ORAL_TABLET | Freq: Every day | ORAL | 0 refills | Status: DC
Start: 2023-06-20 — End: 2023-09-18
  Filled 2023-06-20: qty 90, 90d supply, fill #0

## 2023-06-20 MED ORDER — HYDROCHLOROTHIAZIDE 25 MG PO TABS
25.0000 mg | ORAL_TABLET | Freq: Every day | ORAL | 0 refills | Status: DC
Start: 2023-06-20 — End: 2023-09-18
  Filled 2023-06-20: qty 90, 90d supply, fill #0

## 2023-06-28 ENCOUNTER — Other Ambulatory Visit (HOSPITAL_COMMUNITY): Payer: Self-pay

## 2023-07-03 ENCOUNTER — Other Ambulatory Visit (HOSPITAL_COMMUNITY): Payer: Self-pay

## 2023-07-05 ENCOUNTER — Other Ambulatory Visit (HOSPITAL_COMMUNITY): Payer: Self-pay

## 2023-07-23 ENCOUNTER — Other Ambulatory Visit (HOSPITAL_COMMUNITY): Payer: Self-pay

## 2023-07-23 ENCOUNTER — Other Ambulatory Visit: Payer: Self-pay | Admitting: Primary Care

## 2023-07-23 DIAGNOSIS — F3342 Major depressive disorder, recurrent, in full remission: Secondary | ICD-10-CM

## 2023-07-23 MED ORDER — VENLAFAXINE HCL ER 37.5 MG PO CP24
37.5000 mg | ORAL_CAPSULE | Freq: Every day | ORAL | 0 refills | Status: DC
  Filled 2023-07-23: qty 90, 90d supply, fill #0

## 2023-07-26 ENCOUNTER — Encounter: Payer: Self-pay | Admitting: Orthopedic Surgery

## 2023-07-30 ENCOUNTER — Encounter: Payer: Self-pay | Admitting: Primary Care

## 2023-07-30 ENCOUNTER — Encounter: Payer: Self-pay | Admitting: Orthopedic Surgery

## 2023-07-31 ENCOUNTER — Encounter: Payer: Self-pay | Admitting: Primary Care

## 2023-08-02 ENCOUNTER — Other Ambulatory Visit: Payer: Self-pay

## 2023-08-02 ENCOUNTER — Inpatient Hospital Stay: Admission: RE | Admit: 2023-08-02 | Payer: Self-pay | Source: Ambulatory Visit

## 2023-08-20 ENCOUNTER — Other Ambulatory Visit: Payer: Self-pay | Admitting: Primary Care

## 2023-08-20 DIAGNOSIS — E66812 Obesity, class 2: Secondary | ICD-10-CM

## 2023-08-20 DIAGNOSIS — H52223 Regular astigmatism, bilateral: Secondary | ICD-10-CM | POA: Diagnosis not present

## 2023-08-21 ENCOUNTER — Other Ambulatory Visit (HOSPITAL_COMMUNITY): Payer: Self-pay

## 2023-08-21 MED ORDER — ZEPBOUND 10 MG/0.5ML ~~LOC~~ SOAJ
10.0000 mg | SUBCUTANEOUS | 0 refills | Status: DC
  Filled 2023-08-21: qty 2, 28d supply, fill #0

## 2023-08-21 NOTE — Telephone Encounter (Signed)
Lvm for patient tcb and schedule 

## 2023-08-21 NOTE — Telephone Encounter (Signed)
Patient is due for CPE/follow up in December. Please schedule, thank you!

## 2023-09-05 ENCOUNTER — Other Ambulatory Visit (HOSPITAL_COMMUNITY): Payer: Self-pay

## 2023-09-05 ENCOUNTER — Encounter: Payer: Self-pay | Admitting: Primary Care

## 2023-09-05 ENCOUNTER — Other Ambulatory Visit: Payer: Self-pay | Admitting: Primary Care

## 2023-09-05 ENCOUNTER — Ambulatory Visit (INDEPENDENT_AMBULATORY_CARE_PROVIDER_SITE_OTHER): Payer: 59 | Admitting: Primary Care

## 2023-09-05 VITALS — BP 118/80 | HR 60 | Temp 97.1°F | Ht 69.0 in | Wt 241.2 lb

## 2023-09-05 DIAGNOSIS — E785 Hyperlipidemia, unspecified: Secondary | ICD-10-CM | POA: Diagnosis not present

## 2023-09-05 DIAGNOSIS — I10 Essential (primary) hypertension: Secondary | ICD-10-CM

## 2023-09-05 DIAGNOSIS — E66812 Obesity, class 2: Secondary | ICD-10-CM | POA: Diagnosis not present

## 2023-09-05 DIAGNOSIS — Z0000 Encounter for general adult medical examination without abnormal findings: Secondary | ICD-10-CM | POA: Diagnosis not present

## 2023-09-05 DIAGNOSIS — M1A9XX0 Chronic gout, unspecified, without tophus (tophi): Secondary | ICD-10-CM | POA: Diagnosis not present

## 2023-09-05 DIAGNOSIS — Z800 Family history of malignant neoplasm of digestive organs: Secondary | ICD-10-CM | POA: Diagnosis not present

## 2023-09-05 DIAGNOSIS — F3342 Major depressive disorder, recurrent, in full remission: Secondary | ICD-10-CM | POA: Diagnosis not present

## 2023-09-05 DIAGNOSIS — E559 Vitamin D deficiency, unspecified: Secondary | ICD-10-CM

## 2023-09-05 DIAGNOSIS — E876 Hypokalemia: Secondary | ICD-10-CM

## 2023-09-05 DIAGNOSIS — R7303 Prediabetes: Secondary | ICD-10-CM | POA: Diagnosis not present

## 2023-09-05 DIAGNOSIS — R7989 Other specified abnormal findings of blood chemistry: Secondary | ICD-10-CM

## 2023-09-05 DIAGNOSIS — Z113 Encounter for screening for infections with a predominantly sexual mode of transmission: Secondary | ICD-10-CM | POA: Diagnosis not present

## 2023-09-05 LAB — COMPREHENSIVE METABOLIC PANEL
ALT: 24 U/L (ref 0–53)
AST: 20 U/L (ref 0–37)
Albumin: 4.3 g/dL (ref 3.5–5.2)
Alkaline Phosphatase: 61 U/L (ref 39–117)
BUN: 19 mg/dL (ref 6–23)
CO2: 32 meq/L (ref 19–32)
Calcium: 9.8 mg/dL (ref 8.4–10.5)
Chloride: 99 meq/L (ref 96–112)
Creatinine, Ser: 1.29 mg/dL (ref 0.40–1.50)
GFR: 65.92 mL/min (ref >60.00)
Glucose, Bld: 78 mg/dL (ref 70–99)
Potassium: 3.2 meq/L — ABNORMAL LOW (ref 3.5–5.1)
Sodium: 139 meq/L (ref 135–145)
Total Bilirubin: 0.6 mg/dL (ref 0.2–1.2)
Total Protein: 7.5 g/dL (ref 6.0–8.3)

## 2023-09-05 LAB — URIC ACID: Uric Acid, Serum: 5.6 mg/dL (ref 4.0–7.8)

## 2023-09-05 LAB — LIPID PANEL
Cholesterol: 189 mg/dL (ref 0–200)
HDL: 38.5 mg/dL — ABNORMAL LOW (ref >39.00)
LDL Cholesterol: 131 mg/dL — ABNORMAL HIGH (ref 0–99)
NonHDL: 150.02
Total CHOL/HDL Ratio: 5
Triglycerides: 95 mg/dL (ref 0.0–149.0)
VLDL: 19 mg/dL (ref 0.0–40.0)

## 2023-09-05 LAB — VITAMIN D 25 HYDROXY (VIT D DEFICIENCY, FRACTURES): VITD: 28.71 ng/mL — ABNORMAL LOW (ref 30.00–100.00)

## 2023-09-05 LAB — TESTOSTERONE: Testosterone: 273.56 ng/dL — ABNORMAL LOW (ref 300.00–890.00)

## 2023-09-05 LAB — HEMOGLOBIN A1C: Hgb A1c MFr Bld: 5.9 % (ref 4.6–6.5)

## 2023-09-05 MED ORDER — POTASSIUM CHLORIDE CRYS ER 20 MEQ PO TBCR
20.0000 meq | EXTENDED_RELEASE_TABLET | Freq: Two times a day (BID) | ORAL | 0 refills | Status: DC
  Filled 2023-09-05: qty 10, 5d supply, fill #0

## 2023-09-05 NOTE — Assessment & Plan Note (Signed)
No recent gout flares.  Continue allopurinol 300 mg daily. Uric acid pending.

## 2023-09-05 NOTE — Assessment & Plan Note (Signed)
Colonoscopy UTD, due 2026.

## 2023-09-05 NOTE — Progress Notes (Signed)
Subjective:    Patient ID: Joseph Ferguson, male    DOB: 23-May-1976, 47 y.o.   MRN: [id number]  HPI  Joseph Ferguson is a very pleasant 47 y.o. male who presents today for complete physical and follow up of chronic conditions.  Immunizations: -Tetanus: Completed in 2022 -Influenza: Completed this season   Diet: Fair diet.  Exercise: Exercising regularly  Eye exam: Completes annually  Dental exam: Completes semi-annually    Colonoscopy: Completed in 2021, due 2026  Wt Readings from Last 3 Encounters:  09/05/23 241 lb 4 oz (109.4 kg)  04/24/23 246 lb (111.6 kg)  11/01/22 269 lb (122 kg)   BP Readings from Last 3 Encounters:  09/05/23 118/80  04/24/23 124/62  11/01/22 130/84        Review of Systems  Constitutional:  Negative for unexpected weight change.  HENT:  Negative for rhinorrhea.   Respiratory:  Negative for cough and shortness of breath.   Cardiovascular:  Negative for chest pain.  Gastrointestinal:  Negative for constipation and diarrhea.  Genitourinary:  Negative for difficulty urinating.  Musculoskeletal:  Negative for arthralgias and myalgias.  Skin:  Negative for rash.  Allergic/Immunologic: Negative for environmental allergies.  Neurological:  Negative for dizziness, numbness and headaches.  Psychiatric/Behavioral:  The patient is not nervous/anxious.          Past Medical History:  Diagnosis Date   Allergy    Depression    Gout    Hamstring strain, right, initial encounter 07/27/2021   Hemorrhoids 12/11/2007   Qualifier: Diagnosis of   By: Yetta Barre RN, York Pellant, Sheri      IMO SNOMED Dx Update Oct 2024     History of colonic polyps 07/17/2011   Hypertension    Localized swelling of finger of left hand 07/25/2022   Sickle cell trait (HCC)    Sleep apnea    wears cpap     Social History   Socioeconomic History   Marital status: Single    Spouse name: Not on file   Number of children: Not on file   Years of education: Not on file    Highest education level: Not on file  Occupational History   Not on file  Tobacco Use   Smoking status: Never   Smokeless tobacco: Never  Substance and Sexual Activity   Alcohol use: Yes    Alcohol/week: 0.0 standard drinks of alcohol    Comment: rarely   Drug use: No   Sexual activity: Yes    Birth control/protection: None  Other Topics Concern   Not on file  Social History Narrative   Divorced.   2 children.   CRNA, works for [employer].    Enjoys spending time with children, relaxing.    Social Determinants of Health   Financial Resource Strain: Not on file  Food Insecurity: Not on file  Transportation Needs: Not on file  Physical Activity: Not on file  Stress: Not on file  Social Connections: Unknown (05/07/2022)   [facility]   Social Network    Social Network: Not on file  Intimate Partner Violence: Unknown (05/07/2022)   Received from [facility], [facility]   HITS    Physically Hurt: Not on file    Insult or Talk Down To: Not on file    Threaten Physical Harm: Not on file    Scream or Curse: Not on file    Past Surgical History:  Procedure Laterality Date   COLONOSCOPY  2012   POLYPECTOMY     WISDOM TOOTH EXTRACTION      Family History  Problem Relation Age of Onset   Colon cancer Maternal Grandfather 34   Hypertension Father    Colon polyps Neg Hx    Esophageal cancer Neg Hx    Rectal cancer Neg Hx    Stomach cancer Neg Hx     Allergies  Allergen Reactions   Shellfish Allergy Swelling    Current Outpatient Medications on File Prior to Visit  Medication Sig Dispense Refill   allopurinol (ZYLOPRIM) 300 MG tablet Take 1 tablet (300 mg total) by mouth daily. For gout prevention. 90 tablet 2   amLODipine (NORVASC) 10 MG tablet Take 1 tablet (10 mg total) by mouth daily for blood pressure 90 tablet 0   Azelastine HCl 137 MCG/SPRAY SOLN Place 1-2 sprays into both nostrils 2 (two) times daily as needed. 30 mL 11    fluticasone (FLONASE) 50 MCG/ACT nasal spray Place 1 spray into both nostrils 2 (two) times daily or 2 sprays into each nostril once daily 16 g 11   hydrochlorothiazide (HYDRODIURIL) 25 MG tablet Take 1 tablet (25 mg total) by mouth daily for blood pressure. 90 tablet 0   ibuprofen (ADVIL) 800 MG tablet Take 1 tablet (800 mg total) by mouth 3 (three) times daily. 60 tablet 1   losartan (COZAAR) 50 MG tablet Take 1 tablet (50 mg total) by mouth daily. for blood pressure. 90 tablet 2   Multiple Vitamin (MULTIVITAMIN PO) Take by mouth daily. [employer] Men's multivitamin     tirzepatide (ZEPBOUND) 10 MG/0.5ML Pen Inject 10 mg into the skin once a week. 2 mL 0   venlafaxine XR (EFFEXOR-XR) 37.5 MG 24 hr capsule Take 1 capsule (37.5 mg total) by mouth daily with breakfast for depression. 90 capsule 0   No current facility-administered medications on file prior to visit.    BP 118/80   Pulse 60   Temp (!) 97.1 F (36.2 C) (Oral)   Ht 5\' 9"  (1.753 m)   Wt 241 lb 4 oz (109.4 kg)   SpO2 99%   BMI 35.63 kg/m  Objective:   Physical Exam HENT:     Right Ear: Tympanic membrane and ear canal normal.     Left Ear: Tympanic membrane and ear canal normal.  Eyes:     Pupils: Pupils are equal, round, and reactive to light.  Cardiovascular:     Rate and Rhythm: Normal rate and regular rhythm.  Pulmonary:     Effort: Pulmonary effort is normal.     Breath sounds: Normal breath sounds.  Abdominal:     General: Bowel sounds are normal.     Palpations: Abdomen is soft.     Tenderness: There is no abdominal tenderness.  Musculoskeletal:        General: Normal range of motion.     Cervical back: Neck supple.  Skin:    General: Skin is warm and dry.  Neurological:     Mental Status: He is alert and oriented to person, place, and time.     Cranial Nerves: No cranial nerve deficit.     Deep Tendon Reflexes:     Reflex Scores:      Patellar reflexes are 2+ on the right side and 2+ on the left  side. Psychiatric:        Mood and Affect: Mood normal.           Assessment & Plan:  Preventative health  care Assessment & Plan: Immunizations UTD. Colonoscopy UTD, due 2026  Discussed the importance of a healthy diet and regular exercise in order for weight loss, and to reduce the risk of further co-morbidity.  Exam stable. Labs pending.  Follow up in 1 year for repeat physical.    Essential hypertension Assessment & Plan: Controlled.  Continue amlodipine 10 mg daily, hydrochlorothiazide 25 mg daily, losartan 50 mg daily.   CMP pending.  Orders: -     Comprehensive metabolic panel  Chronic gout involving toe without tophus, unspecified cause, unspecified laterality Assessment & Plan: No recent gout flares.  Continue allopurinol 300 mg daily. Uric acid pending.  Orders: -     Uric acid  Family history of colon cancer Assessment & Plan: Colonoscopy UTD, due 2026.   Hyperlipidemia, unspecified hyperlipidemia type Assessment & Plan: Repeat lipid panel pending.  Commended him on weight loss.   Orders: -     Lipid panel  Prediabetes Assessment & Plan: Repeat A1C pending.  Commended him on weight loss.  Orders: -     Hemoglobin A1c  Vitamin D deficiency Assessment & Plan: Continue vitamin D 3000 international units daily. Repeat vitamin D pending.   Orders: -     VITAMIN D 25 Hydroxy (Vit-D Deficiency, Fractures)  Class 2 severe obesity due to excess calories with serious comorbidity and body mass index (BMI) of 35.0 to 35.9 in adult [facility]) Assessment & Plan: Improving!  Continue Zepbound 10 mg weekly x 2 more weeks, then increase to 12 mg weekly x 4 weeks then increase to 15 mg weekly thereafter. Labs pending today.   Low testosterone in male Assessment & Plan: Repeat testosterone level pending.  Orders: -     Testosterone  Recurrent major depressive disorder, in full remission (HCC) Assessment & Plan: Stable.  Continue  venlafaxine ER 37.5 mg daily.         Doreene Nest, NP

## 2023-09-05 NOTE — Assessment & Plan Note (Signed)
Improving!  Continue Zepbound 10 mg weekly x 2 more weeks, then increase to 12 mg weekly x 4 weeks then increase to 15 mg weekly thereafter. Labs pending today.

## 2023-09-05 NOTE — Patient Instructions (Signed)
Stop by the lab prior to leaving today. I will notify you of your results once received.   Notify me once you use your last 10 mg dose of Zepbound.  It was a pleasure to see you today!

## 2023-09-05 NOTE — Assessment & Plan Note (Signed)
Repeat testosterone level pending.

## 2023-09-05 NOTE — Assessment & Plan Note (Signed)
Controlled.  Continue amlodipine 10 mg daily, hydrochlorothiazide 25 mg daily, losartan 50 mg daily.   CMP pending.

## 2023-09-05 NOTE — Assessment & Plan Note (Signed)
Continue vitamin D 3000 international units daily. Repeat vitamin D pending.

## 2023-09-05 NOTE — Assessment & Plan Note (Addendum)
Immunizations UTD. Colonoscopy UTD, due 2026  Discussed the importance of a healthy diet and regular exercise in order for weight loss, and to reduce the risk of further co-morbidity.  Exam stable. Labs pending.  He would also like STD testing.  Asymptomatic.  Follow up in 1 year for repeat physical.

## 2023-09-05 NOTE — Assessment & Plan Note (Signed)
Repeat lipid panel pending.  Commended him on weight loss!

## 2023-09-05 NOTE — Assessment & Plan Note (Signed)
Repeat A1C pending. Commended him on weight loss!

## 2023-09-05 NOTE — Assessment & Plan Note (Signed)
Stable.  Continue venlafaxine ER 37.5 mg daily.

## 2023-09-12 ENCOUNTER — Other Ambulatory Visit (HOSPITAL_COMMUNITY): Payer: Self-pay

## 2023-09-12 NOTE — Progress Notes (Signed)
Joseph Ferguson [address] [place] [address] Phone: [phone] Subjective:   Joseph Ferguson, am serving as a scribe for Dr. Antoine Primas.  I'm seeing this patient by the request  of:  Doreene Nest, NP  CC: Back and neck pain follow-up  [id number]  [name] is a 47 y.o. male coming in with complaint of back and neck pain. OMT 07/27/2023. Patient states that his pain is in R SI joint the extends to middle of R hamstring.   Medications patient has been prescribed: None  Taking:         Reviewed prior external information including notes and imaging from previsou exam, outside providers and external EMR if available.   As well as notes that were available from care everywhere and other healthcare systems.  Past medical history, social, surgical and family history all reviewed in electronic medical record.  No pertanent information unless stated regarding to the chief complaint.   Past Medical History:  Diagnosis Date   Allergy    Depression    Gout    Hamstring strain, right, initial encounter 07/27/2021   Hemorrhoids 12/11/2007   Qualifier: Diagnosis of   By: Yetta Barre RN, York Pellant, Sheri      IMO SNOMED Dx Update Oct 2024     History of colonic polyps 07/17/2011   Hypertension    Localized swelling of finger of left hand 07/25/2022   Sickle cell trait (HCC)    Sleep apnea    wears cpap     Allergies  Allergen Reactions   Shellfish Allergy Swelling     Review of Systems:  No headache, visual changes, nausea, vomiting, diarrhea, constipation, dizziness, abdominal pain, skin rash, fevers, chills, night sweats, weight loss, swollen lymph nodes, body aches, joint swelling, chest pain, shortness of breath, mood changes. POSITIVE muscle aches  Objective  Blood pressure 98/76, pulse 92, height 5\' 9"  (1.753 m), weight 241 lb (109.3 kg), SpO2 99%.   General: No apparent distress alert and oriented x3 mood and affect  normal, dressed appropriately.  HEENT: Pupils equal, extraocular movements intact  Respiratory: Patient's speak in full sentences and does not appear short of breath  Cardiovascular: No lower extremity edema, non tender, no erythema  Back exam does have some loss of lordosis noted.  Still significant tightness noted in the strain on the right side.  Patient does have though some improvement in core strength from previous exam.  Osteopathic findings  T5 extended rotated and side bent right inhaled rib T7 extended rotated and side bent left L2 flexed rotated and side bent right Sacrum right on right       Assessment and Plan:  Right lumbar radiculopathy Continue to have the tightness noted.  Discussed which activities to do and which ones to avoid, we discussed with patient that we could consider another epidural which she will consider.  Attempted osteopathic manipulation with some good resolution of some discomfort and some improvement in range of motion.  Could do some different changes in medication as well.  Patient will consider and we will give him a prescription for the meloxicam as well as Zanaflex for any breakthrough pain.    Nonallopathic problems  Decision today to treat with OMT was based on Physical Exam  After verbal consent patient was treated with HVLA, ME, FPR techniques in  rib, thoracic, lumbar, and sacral  areas  Patient tolerated the procedure well with improvement in symptoms  Patient given exercises,  stretches and lifestyle modifications  See medications in patient instructions if given  Patient will follow up in 4-8 weeks    The above documentation has been reviewed and is accurate and complete Judi Saa, DO          Note: This dictation was prepared with Dragon dictation along with smaller phrase technology. Any transcriptional errors that result from this process are unintentional.

## 2023-09-14 LAB — C. TRACHOMATIS/N. GONORRHOEAE RNA
C. trachomatis RNA, TMA: NOT DETECTED
N. gonorrhoeae RNA, TMA: NOT DETECTED

## 2023-09-14 LAB — HIV ANTIBODY (ROUTINE TESTING W REFLEX): HIV 1&2 Ab, 4th Generation: NONREACTIVE

## 2023-09-14 LAB — RPR: RPR Ser Ql: NONREACTIVE

## 2023-09-14 LAB — TRICHOMONAS VAGINALIS, PROBE AMP: Trichomonas vaginalis RNA: NOT DETECTED

## 2023-09-14 LAB — HERPES SIMPLEX VIRUS 1 AND 2 (IGG),REFLEX HSV-2 INHIBITION
HSV 1 IGG,TYPE SPECIFIC AB: 3.46 {index} — ABNORMAL HIGH
HSV 2 IGG,TYPE SPECIFIC AB: >23 {index} — ABNORMAL HIGH

## 2023-09-14 LAB — HEPATITIS C ANTIBODY: Hepatitis C Ab: NONREACTIVE

## 2023-09-14 LAB — HSV 2 INHIBITION: HSV 2 IGG INHIBITION,IA: POSITIVE — AB

## 2023-09-16 ENCOUNTER — Encounter: Payer: Self-pay | Admitting: Family Medicine

## 2023-09-16 ENCOUNTER — Other Ambulatory Visit (HOSPITAL_COMMUNITY): Payer: Self-pay

## 2023-09-16 ENCOUNTER — Ambulatory Visit (INDEPENDENT_AMBULATORY_CARE_PROVIDER_SITE_OTHER): Payer: 59 | Admitting: Family Medicine

## 2023-09-16 VITALS — BP 98/76 | HR 92 | Ht 69.0 in | Wt 241.0 lb

## 2023-09-16 DIAGNOSIS — M5416 Radiculopathy, lumbar region: Secondary | ICD-10-CM

## 2023-09-16 DIAGNOSIS — M9902 Segmental and somatic dysfunction of thoracic region: Secondary | ICD-10-CM

## 2023-09-16 DIAGNOSIS — M9903 Segmental and somatic dysfunction of lumbar region: Secondary | ICD-10-CM | POA: Diagnosis not present

## 2023-09-16 DIAGNOSIS — M9904 Segmental and somatic dysfunction of sacral region: Secondary | ICD-10-CM | POA: Diagnosis not present

## 2023-09-16 MED ORDER — TIZANIDINE HCL 4 MG PO TABS
4.0000 mg | ORAL_TABLET | Freq: Every day | ORAL | 0 refills | Status: DC
  Filled 2023-09-16: qty 30, 30d supply, fill #0

## 2023-09-16 MED ORDER — MELOXICAM 15 MG PO TABS
15.0000 mg | ORAL_TABLET | Freq: Every day | ORAL | 0 refills | Status: DC
  Filled 2023-09-16: qty 30, 30d supply, fill #0

## 2023-09-16 NOTE — Assessment & Plan Note (Signed)
Continue to have the tightness noted.  Discussed which activities to do and which ones to avoid, we discussed with patient that we could consider another epidural which she will consider.  Attempted osteopathic manipulation with some good resolution of some discomfort and some improvement in range of motion.  Could do some different changes in medication as well.  Patient will consider and we will give him a prescription for the meloxicam as well as Zanaflex for any breakthrough pain.

## 2023-09-16 NOTE — Patient Instructions (Addendum)
Zanaflex 4mg  at night when needed Meloxicam 15mg  when needed See me in 2 months

## 2023-09-18 ENCOUNTER — Other Ambulatory Visit: Payer: Self-pay

## 2023-09-18 ENCOUNTER — Other Ambulatory Visit (HOSPITAL_COMMUNITY): Payer: Self-pay

## 2023-09-18 ENCOUNTER — Other Ambulatory Visit: Payer: Self-pay | Admitting: Primary Care

## 2023-09-18 DIAGNOSIS — F3342 Major depressive disorder, recurrent, in full remission: Secondary | ICD-10-CM

## 2023-09-18 DIAGNOSIS — M1A9XX0 Chronic gout, unspecified, without tophus (tophi): Secondary | ICD-10-CM

## 2023-09-18 DIAGNOSIS — I10 Essential (primary) hypertension: Secondary | ICD-10-CM

## 2023-09-18 DIAGNOSIS — E66812 Obesity, class 2: Secondary | ICD-10-CM

## 2023-09-18 MED ORDER — LOSARTAN POTASSIUM 50 MG PO TABS
50.0000 mg | ORAL_TABLET | Freq: Every day | ORAL | 3 refills | Status: DC
  Filled 2023-09-18: qty 90, 90d supply, fill #0
  Filled 2023-12-22: qty 90, 90d supply, fill #1
  Filled 2024-05-05: qty 90, 90d supply, fill #2

## 2023-09-18 MED ORDER — TIRZEPATIDE-WEIGHT MANAGEMENT 12.5 MG/0.5ML ~~LOC~~ SOAJ
12.5000 mg | SUBCUTANEOUS | 0 refills | Status: DC
  Filled 2023-09-18: qty 2, 28d supply, fill #0
  Filled 2023-10-15 – 2023-10-16 (×2): qty 2, 28d supply, fill #1

## 2023-09-18 MED ORDER — HYDROCHLOROTHIAZIDE 25 MG PO TABS
25.0000 mg | ORAL_TABLET | Freq: Every day | ORAL | 3 refills | Status: DC
  Filled 2023-09-18: qty 90, 90d supply, fill #0
  Filled 2023-12-22: qty 90, 90d supply, fill #1
  Filled 2024-05-05: qty 90, 90d supply, fill #2

## 2023-09-18 MED ORDER — AMLODIPINE BESYLATE 10 MG PO TABS
10.0000 mg | ORAL_TABLET | Freq: Every day | ORAL | 3 refills | Status: DC
  Filled 2023-09-18: qty 90, 90d supply, fill #0
  Filled 2023-12-22: qty 90, 90d supply, fill #1
  Filled 2024-05-05: qty 90, 90d supply, fill #2

## 2023-09-18 MED ORDER — VENLAFAXINE HCL ER 37.5 MG PO CP24
37.5000 mg | ORAL_CAPSULE | Freq: Every day | ORAL | 3 refills | Status: DC
  Filled 2023-09-18 – 2023-10-16 (×3): qty 90, 90d supply, fill #0
  Filled 2024-01-26: qty 90, 90d supply, fill #1
  Filled 2024-05-05: qty 90, 90d supply, fill #2
  Filled 2024-06-22 – 2024-08-05 (×2): qty 90, 90d supply, fill #3

## 2023-09-18 MED ORDER — ALLOPURINOL 300 MG PO TABS
300.0000 mg | ORAL_TABLET | Freq: Every day | ORAL | 3 refills | Status: DC
  Filled 2023-09-18: qty 90, 90d supply, fill #0
  Filled 2023-12-22: qty 90, 90d supply, fill #1
  Filled 2024-05-05: qty 90, 90d supply, fill #2

## 2023-10-15 ENCOUNTER — Ambulatory Visit: Payer: 59 | Admitting: Family Medicine

## 2023-10-15 ENCOUNTER — Other Ambulatory Visit (HOSPITAL_COMMUNITY): Payer: Self-pay

## 2023-10-16 ENCOUNTER — Other Ambulatory Visit (HOSPITAL_COMMUNITY): Payer: Self-pay

## 2023-10-16 ENCOUNTER — Other Ambulatory Visit: Payer: Self-pay

## 2023-10-17 ENCOUNTER — Encounter: Payer: Self-pay | Admitting: Family Medicine

## 2023-10-21 ENCOUNTER — Other Ambulatory Visit (HOSPITAL_COMMUNITY): Payer: Self-pay

## 2023-10-21 ENCOUNTER — Other Ambulatory Visit: Payer: Self-pay | Admitting: Family Medicine

## 2023-10-21 DIAGNOSIS — E785 Hyperlipidemia, unspecified: Secondary | ICD-10-CM

## 2023-10-21 DIAGNOSIS — M5416 Radiculopathy, lumbar region: Secondary | ICD-10-CM

## 2023-10-21 DIAGNOSIS — I10 Essential (primary) hypertension: Secondary | ICD-10-CM

## 2023-10-21 DIAGNOSIS — R7303 Prediabetes: Secondary | ICD-10-CM

## 2023-10-21 DIAGNOSIS — R7989 Other specified abnormal findings of blood chemistry: Secondary | ICD-10-CM

## 2023-10-21 MED ORDER — ZEPBOUND 15 MG/0.5ML ~~LOC~~ SOAJ
15.0000 mg | SUBCUTANEOUS | 0 refills | Status: DC
  Filled 2023-10-21 – 2023-11-19 (×2): qty 2, 28d supply, fill #0
  Filled 2023-12-22: qty 2, 28d supply, fill #1
  Filled 2024-01-22: qty 2, 28d supply, fill #2

## 2023-10-22 ENCOUNTER — Other Ambulatory Visit (HOSPITAL_COMMUNITY): Payer: Self-pay

## 2023-11-11 ENCOUNTER — Other Ambulatory Visit: Payer: Self-pay

## 2023-11-11 ENCOUNTER — Ambulatory Visit
Admission: RE | Admit: 2023-11-11 | Discharge: 2023-11-11 | Disposition: A | Discharge status: Home / Self Care | Payer: 59 | Source: Ambulatory Visit | Attending: Orthopedic Surgery | Admitting: Orthopedic Surgery

## 2023-11-11 ENCOUNTER — Ambulatory Visit
Admission: RE | Admit: 2023-11-11 | Discharge: 2023-11-11 | Discharge status: Home / Self Care | Payer: 59 | Source: Ambulatory Visit | Attending: Orthopedic Surgery | Admitting: Orthopedic Surgery

## 2023-11-11 ENCOUNTER — Ambulatory Visit
Admission: RE | Admit: 2023-11-11 | Discharge: 2023-11-11 | Disposition: A | Discharge status: Home / Self Care | Payer: Self-pay | Source: Ambulatory Visit | Attending: Orthopedic Surgery | Admitting: Orthopedic Surgery

## 2023-11-11 DIAGNOSIS — M7521 Bicipital tendinitis, right shoulder: Secondary | ICD-10-CM | POA: Diagnosis not present

## 2023-11-11 DIAGNOSIS — M25512 Pain in left shoulder: Secondary | ICD-10-CM | POA: Diagnosis not present

## 2023-11-11 DIAGNOSIS — M25521 Pain in right elbow: Secondary | ICD-10-CM

## 2023-11-11 DIAGNOSIS — M7582 Other shoulder lesions, left shoulder: Secondary | ICD-10-CM | POA: Diagnosis not present

## 2023-11-11 DIAGNOSIS — M75112 Incomplete rotator cuff tear or rupture of left shoulder, not specified as traumatic: Secondary | ICD-10-CM | POA: Diagnosis not present

## 2023-11-11 MED ORDER — IOPAMIDOL (ISOVUE-M 200) INJECTION 41%
10.0000 mL | Freq: Once | INTRAMUSCULAR | Status: AC
  Administered 2023-11-11: 10 mL via INTRA_ARTICULAR

## 2023-11-13 NOTE — Progress Notes (Signed)
 lmom

## 2023-11-19 ENCOUNTER — Other Ambulatory Visit (HOSPITAL_COMMUNITY): Payer: Self-pay

## 2023-11-21 ENCOUNTER — Other Ambulatory Visit (HOSPITAL_COMMUNITY): Payer: Self-pay

## 2023-12-10 NOTE — Progress Notes (Signed)
Lmom #2

## 2023-12-12 ENCOUNTER — Encounter: Payer: Self-pay | Admitting: Orthopedic Surgery

## 2023-12-16 NOTE — Discharge Instructions (Signed)

## 2023-12-17 ENCOUNTER — Ambulatory Visit
Admission: RE | Admit: 2023-12-17 | Discharge: 2023-12-17 | Disposition: A | Discharge status: Home / Self Care | Payer: 59 | Source: Ambulatory Visit | Attending: Family Medicine | Admitting: Family Medicine

## 2023-12-17 DIAGNOSIS — M5416 Radiculopathy, lumbar region: Secondary | ICD-10-CM

## 2023-12-17 DIAGNOSIS — M47817 Spondylosis without myelopathy or radiculopathy, lumbosacral region: Secondary | ICD-10-CM | POA: Diagnosis not present

## 2023-12-17 MED ORDER — METHYLPREDNISOLONE ACETATE 40 MG/ML INJ SUSP (RADIOLOG
80.0000 mg | Freq: Once | INTRAMUSCULAR | Status: AC
  Administered 2023-12-17: 80 mg via INTRA_ARTICULAR

## 2023-12-17 MED ORDER — IOPAMIDOL (ISOVUE-M 200) INJECTION 41%
1.0000 mL | Freq: Once | INTRAMUSCULAR | Status: AC
  Administered 2023-12-17: 1 mL via INTRA_ARTICULAR

## 2023-12-23 ENCOUNTER — Other Ambulatory Visit (HOSPITAL_COMMUNITY): Payer: Self-pay

## 2024-01-20 NOTE — Progress Notes (Signed)
 01/21/2024 11:57 AM   Joseph Ferguson 1976-06-23 [id number]  Referring provider: Doreene Nest, NP [address] [place],  [place] [address]  No chief complaint on file.   HPI: 48 year old male last seen in [place] at [facility] urology in February, 2023.  He was seen routinely for "prostate checks", and at the time of his visit 2 years ago was complaining of low libido and possible low testosterone level.  He was having no lower urinary tract symptoms.  Testosterone level was 326, free testosterone 83, both normal. PSA was 0.93 Prolactin was 8.2  More recently, testosterone was 274  PMH: Past Medical History:  Diagnosis Date   Allergy    Depression    Gout    Hamstring strain, right, initial encounter 07/27/2021   Hemorrhoids 12/11/2007   Qualifier: Diagnosis of   By: Stefani Dama, Sheri      IMO SNOMED Dx Update Oct 2024     History of colonic polyps 07/17/2011   Hypertension    Localized swelling of finger of left hand 07/25/2022   Sickle cell trait (HCC)    Sleep apnea    wears cpap     Surgical History: Past Surgical History:  Procedure Laterality Date   COLONOSCOPY  2012   POLYPECTOMY     WISDOM TOOTH EXTRACTION      Home Medications:  Allergies as of 01/21/2024       Reactions   Shellfish Allergy Swelling        Medication List        Accurate as of January 20, 2024 11:57 AM. If you have any questions, ask your nurse or doctor.          allopurinol 300 MG tablet Commonly known as: ZYLOPRIM Take 1 tablet (300 mg total) by mouth daily for gout prevention.   amLODipine 10 MG tablet Commonly known as: NORVASC Take 1 tablet (10 mg total) by mouth daily for blood pressure   Azelastine HCl 137 MCG/SPRAY Soln Place 1-2 sprays into both nostrils 2 (two) times daily as needed.   fluticasone 50 MCG/ACT nasal spray Commonly known as: FLONASE Place 1 spray into both nostrils 2 (two) times daily or 2 sprays into each nostril once daily    hydrochlorothiazide 25 MG tablet Commonly known as: HYDRODIURIL Take 1 tablet (25 mg total) by mouth daily for blood pressure.   ibuprofen 800 MG tablet Commonly known as: ADVIL Take 1 tablet (800 mg total) by mouth 3 (three) times daily.   losartan 50 MG tablet Commonly known as: COZAAR Take 1 tablet (50 mg total) by mouth daily for blood pressure.   meloxicam 15 MG tablet Commonly known as: MOBIC Take 1 tablet (15 mg total) by mouth daily.   MULTIVITAMIN PO Take by mouth daily. [employer] Men's multivitamin   potassium chloride SA 20 MEQ tablet Commonly known as: KLOR-CON M Take 1 tablet (20 mEq total) by mouth 2 (two) times daily. For low potassium.   tiZANidine 4 MG tablet Commonly known as: Zanaflex Take 1 tablet (4 mg total) by mouth at bedtime.   venlafaxine XR 37.5 MG 24 hr capsule Commonly known as: EFFEXOR-XR Take 1 capsule (37.5 mg total) by mouth daily with breakfast for depression.   Zepbound 15 MG/0.5ML Pen Generic drug: tirzepatide Inject 15 mg into the skin once a week.        Allergies:  Allergies  Allergen Reactions   Shellfish Allergy Swelling    Family History: Family History  Problem Relation Age of Onset   Colon cancer Maternal Grandfather 48   Hypertension Father    Colon polyps Neg Hx    Esophageal cancer Neg Hx    Rectal cancer Neg Hx    Stomach cancer Neg Hx     Social History:  reports that he has never smoked. He has never used smokeless tobacco. He reports current alcohol use. He reports that he does not use drugs.  ROS: All other review of systems were reviewed and are negative except what is noted above in HPI  Physical Exam: There were no vitals taken for this visit.  Constitutional:  Alert and oriented, No acute distress. HEENT: [place] AT, moist mucus membranes.  Trachea midline, no masses. Cardiovascular: No clubbing, cyanosis, or edema. Respiratory: Normal respiratory effort, no increased work of breathing. GI: Abdomen is  soft, nontender, nondistended, no abdominal masses GU: No CVA tenderness. Circumcised phallus. No masses/lesions on penis, testis, scrotum. Prostate 40g smooth no nodules no induration.  Lymph: No cervical or inguinal lymphadenopathy. Skin: No rashes, bruises or suspicious lesions. Neurologic: Grossly intact, no focal deficits, moving all 4 extremities. Psychiatric: Normal mood and affect.  Laboratory Data: Lab Results  Component Value Date   WBC 6.6 07/25/2022   HGB 14.2 07/25/2022   HCT 42.8 07/25/2022   MCV 82.0 07/25/2022   PLT 222.0 07/25/2022    Lab Results  Component Value Date   CREATININE 1.29 09/05/2023    No results found for: "PSA"  Lab Results  Component Value Date   TESTOSTERONE 273.56 (L) 09/05/2023    Lab Results  Component Value Date   HGBA1C 5.9 09/05/2023    Urinalysis No results found for: "COLORURINE", "APPEARANCEUR", "LABSPEC", "PHURINE", "GLUCOSEU", "HGBUR", "BILIRUBINUR", "KETONESUR", "PROTEINUR", "UROBILINOGEN", "NITRITE", "LEUKOCYTESUR"  No results found for: "LABMICR", "WBCUA", "RBCUA", "LABEPIT", "MUCUS", "BACTERIA"  Pertinent Imaging:    Assessment & Plan:    There are no diagnoses linked to this encounter.  No follow-ups on file.  Chelsea Aus, MD  [facility] [place]

## 2024-01-21 ENCOUNTER — Ambulatory Visit (INDEPENDENT_AMBULATORY_CARE_PROVIDER_SITE_OTHER): Payer: 59 | Admitting: Urology

## 2024-01-21 ENCOUNTER — Other Ambulatory Visit (HOSPITAL_COMMUNITY): Payer: Self-pay

## 2024-01-21 VITALS — BP 112/80 | HR 74

## 2024-01-21 DIAGNOSIS — N401 Enlarged prostate with lower urinary tract symptoms: Secondary | ICD-10-CM

## 2024-01-21 DIAGNOSIS — Z125 Encounter for screening for malignant neoplasm of prostate: Secondary | ICD-10-CM

## 2024-01-21 DIAGNOSIS — N400 Enlarged prostate without lower urinary tract symptoms: Secondary | ICD-10-CM | POA: Diagnosis not present

## 2024-01-21 DIAGNOSIS — E291 Testicular hypofunction: Secondary | ICD-10-CM

## 2024-01-21 MED ORDER — TESTOSTERONE CYPIONATE 200 MG/ML IM SOLN
100.0000 mg | INTRAMUSCULAR | 1 refills | Status: DC
  Filled 2024-01-21: qty 4, 28d supply, fill #0
  Filled 2024-03-05: qty 4, 28d supply, fill #1
  Filled 2024-04-06: qty 4, 28d supply, fill #2
  Filled 2024-05-05: qty 4, 28d supply, fill #3
  Filled 2024-06-22: qty 4, 28d supply, fill #4

## 2024-01-22 ENCOUNTER — Other Ambulatory Visit (HOSPITAL_COMMUNITY): Payer: Self-pay

## 2024-01-22 LAB — PSA: Prostate Specific Ag, Serum: 2 ng/mL (ref 0.0–4.0)

## 2024-01-26 ENCOUNTER — Encounter: Payer: Self-pay | Admitting: Urology

## 2024-03-03 ENCOUNTER — Other Ambulatory Visit: Payer: Self-pay | Admitting: Primary Care

## 2024-03-03 DIAGNOSIS — I10 Essential (primary) hypertension: Secondary | ICD-10-CM

## 2024-03-03 DIAGNOSIS — E785 Hyperlipidemia, unspecified: Secondary | ICD-10-CM

## 2024-03-03 DIAGNOSIS — R7303 Prediabetes: Secondary | ICD-10-CM

## 2024-03-03 DIAGNOSIS — E66812 Obesity, class 2: Secondary | ICD-10-CM

## 2024-03-05 ENCOUNTER — Other Ambulatory Visit (HOSPITAL_COMMUNITY): Payer: Self-pay

## 2024-03-05 MED ORDER — ZEPBOUND 15 MG/0.5ML ~~LOC~~ SOAJ
15.0000 mg | SUBCUTANEOUS | 0 refills | Status: DC
  Filled 2024-03-05: qty 2, 28d supply, fill #0
  Filled 2024-04-06: qty 2, 28d supply, fill #1
  Filled 2024-05-05: qty 2, 28d supply, fill #2

## 2024-03-17 ENCOUNTER — Other Ambulatory Visit: Payer: Self-pay

## 2024-03-17 ENCOUNTER — Other Ambulatory Visit

## 2024-03-17 DIAGNOSIS — E291 Testicular hypofunction: Secondary | ICD-10-CM

## 2024-03-20 DIAGNOSIS — E291 Testicular hypofunction: Secondary | ICD-10-CM | POA: Diagnosis not present

## 2024-03-21 LAB — CBC
Hematocrit: 48.8 % (ref 37.5–51.0)
Hemoglobin: 16.1 g/dL (ref 13.0–17.7)
MCH: 28.1 pg (ref 26.6–33.0)
MCHC: 33 g/dL (ref 31.5–35.7)
MCV: 85 fL (ref 79–97)
Platelets: 264 10*3/uL (ref 150–450)
RBC: 5.73 x10E6/uL (ref 4.14–5.80)
RDW: 16 % — ABNORMAL HIGH (ref 11.6–15.4)
WBC: 8.5 10*3/uL (ref 3.4–10.8)

## 2024-03-21 LAB — TESTOSTERONE: Testosterone: >1500 ng/dL — ABNORMAL HIGH (ref 264–916)

## 2024-03-23 NOTE — Progress Notes (Unsigned)
 03/24/2024 6:17 PM   Joseph Ferguson [birth year] [id number]  Referring provider: Gabriel John, NP [address] [place],  [place] [address]  No chief complaint on file.   HPI: 48 year old male last seen in [place] at [facility] in February, 2023.  He was seen routinely for "prostate checks", and at the time of his visit 2 years ago was complaining of low libido and possible low testosterone  level.  He was having no lower urinary tract symptoms.  Testosterone  level was 326, free testosterone  83, both normal. PSA was 0.93 Prolactin was 8.2  More recently, testosterone  was 274  He does c/o low libido and low energy level. No c/o ED.  PMH: Past Medical History:  Diagnosis Date   Allergy    Depression    Gout    Hamstring strain, right, initial encounter 07/27/2021   Hemorrhoids 12/11/2007   Qualifier: Diagnosis of   By: Adeline Hone      IMO SNOMED Dx Update Oct 2024     History of colonic polyps 07/17/2011   Hypertension    Localized swelling of finger of left hand 07/25/2022   Sickle cell trait (HCC)    Sleep apnea    wears cpap     Surgical History: Past Surgical History:  Procedure Laterality Date   COLONOSCOPY  2012   POLYPECTOMY     WISDOM TOOTH EXTRACTION      Home Medications:  Allergies as of 03/24/2024       Reactions   Shellfish Allergy Swelling        Medication List        Accurate as of Mar 23, 2024  6:17 PM. If you have any questions, ask your nurse or doctor.          allopurinol  300 MG tablet Commonly known as: ZYLOPRIM  Take 1 tablet (300 mg total) by mouth daily for gout prevention.   amLODipine  10 MG tablet Commonly known as: NORVASC  Take 1 tablet (10 mg total) by mouth daily for blood pressure   Azelastine  HCl 137 MCG/SPRAY Soln Place 1-2 sprays into both nostrils 2 (two) times daily as needed.   fluticasone  50 MCG/ACT nasal spray Commonly known as: FLONASE  Place 1 spray into both nostrils 2 (two) times daily  or 2 sprays into each nostril once daily   hydrochlorothiazide  25 MG tablet Commonly known as: HYDRODIURIL  Take 1 tablet (25 mg total) by mouth daily for blood pressure.   ibuprofen  800 MG tablet Commonly known as: ADVIL  Take 1 tablet (800 mg total) by mouth 3 (three) times daily.   losartan  50 MG tablet Commonly known as: COZAAR  Take 1 tablet (50 mg total) by mouth daily for blood pressure.   meloxicam  15 MG tablet Commonly known as: MOBIC  Take 1 tablet (15 mg total) by mouth daily.   MULTIVITAMIN PO Take by mouth daily. [employer] Men's multivitamin   potassium chloride  SA 20 MEQ tablet Commonly known as: KLOR-CON  M Take 1 tablet (20 mEq total) by mouth 2 (two) times daily. For low potassium.   testosterone  cypionate 200 MG/ML injection Commonly known as: DEPOTESTOSTERONE CYPIONATE Inject 0.5 mLs (100 mg total) into the muscle every 7 (seven) days.   tiZANidine  4 MG tablet Commonly known as: Zanaflex  Take 1 tablet (4 mg total) by mouth at bedtime.   venlafaxine  XR 37.5 MG 24 hr capsule Commonly known as: EFFEXOR -XR Take 1 capsule (37.5 mg total) by mouth daily with breakfast for depression.   Zepbound  15 MG/0.5ML  Pen Generic drug: tirzepatide  Inject 15 mg into the skin once a week.        Allergies:  Allergies  Allergen Reactions   Shellfish Allergy Swelling    Family History: Family History  Problem Relation Age of Onset   Colon cancer Maternal Grandfather 68   Hypertension Father    Colon polyps Neg Hx    Esophageal cancer Neg Hx    Rectal cancer Neg Hx    Stomach cancer Neg Hx     Social History:  reports that he has never smoked. He has never used smokeless tobacco. He reports current alcohol use. He reports that he does not use drugs.  ROS: All other review of systems were reviewed and are negative except what is noted above in HPI  Physical Exam: There were no vitals taken for this visit.  Constitutional:  Alert and oriented, No acute  distress. HEENT:  AT, moist mucus membranes.  Trachea midline, no masses. Cardiovascular: No clubbing, cyanosis, or edema. Respiratory: Normal respiratory effort, no increased work of breathing. GI: No inguinal hernias GU: No CVA tenderness. Circumcised phallus. No masses/lesions on penis, testis, scrotum. Prostate 20g smooth no nodules no induration.  Lymph: No cervical or inguinal lymphadenopathy. Skin: No rashes, bruises or suspicious lesions. Neurologic: Grossly intact, no focal deficits, moving all 4 extremities. Psychiatric: Normal mood and affect.  Laboratory Data: Lab Results  Component Value Date   WBC 8.5 03/20/2024   HGB 16.1 03/20/2024   HCT 48.8 03/20/2024   MCV 85 03/20/2024   PLT 264 03/20/2024    Lab Results  Component Value Date   CREATININE 1.29 09/05/2023    No results found for: "PSA"  Lab Results  Component Value Date   TESTOSTERONE  >1500 (H) 03/20/2024    Lab Results  Component Value Date   HGBA1C 5.9 09/05/2023    Urinalysis No results found for: "COLORURINE", "APPEARANCEUR", "LABSPEC", "PHURINE", "GLUCOSEU", "HGBUR", "BILIRUBINUR", "KETONESUR", "PROTEINUR", "UROBILINOGEN", "NITRITE", "LEUKOCYTESUR"  No results found for: "LABMICR", "WBCUA", "RBCUA", "LABEPIT", "MUCUS", "BACTERIA"  [facility]  I have reviewed gonadotropin levels, testosterone  level, prior PSA level  Most recent CBC/CMP reviewed    Assessment:   1.  Low testosterone  level, symptomatic  2.  Mild BPH symptoms, benign exam  Plan:

## 2024-03-24 ENCOUNTER — Ambulatory Visit: Admitting: Urology

## 2024-04-07 ENCOUNTER — Other Ambulatory Visit: Payer: Self-pay

## 2024-04-07 ENCOUNTER — Other Ambulatory Visit (HOSPITAL_COMMUNITY): Payer: Self-pay

## 2024-05-05 ENCOUNTER — Other Ambulatory Visit (HOSPITAL_COMMUNITY): Payer: Self-pay

## 2024-06-09 NOTE — Progress Notes (Unsigned)
 [facility] [address] [place] [address] Phone: [phone] Subjective:    I'm seeing this patient by the request  of:  Gretta Comer POUR, NP  CC: Back pain follow-up  YEP:[id number]  Joseph Ferguson is a 48 y.o. male coming in with complaint of back and neck pain. OMT Nov 2024.  Patient has had significant difficulty with a chronic right lumbar radiculopathy as well.  Since we have seen patient has seen other providers.  This was for shoulder injury.  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  Regarding his back he did have facet injections in February at the right side L5-S1.  Patient states that he is having tightness in R lumbar spine. Denies any radiating symptom.   Medications patient has been prescribed: None  Taking:         Reviewed prior external information including notes and imaging from previsou exam, outside providers and external EMR if available.   As well as notes that were available from care everywhere and other healthcare systems.  Past medical history, social, surgical and family history all reviewed in electronic medical record.  No pertanent information unless stated regarding to the chief complaint.   Past Medical History:  Diagnosis Date   Allergy    Depression    Gout    Hamstring strain, right, initial encounter 07/27/2021   Hemorrhoids 12/11/2007   Qualifier: Diagnosis of   By: Joshua RN, CELESTER, Sheri      IMO SNOMED Dx Update Oct 2024     History of colonic polyps 07/17/2011   Hypertension    Localized swelling of finger of left hand 07/25/2022   Sickle cell trait (HCC)    Sleep apnea    wears cpap     Allergies  Allergen Reactions   Shellfish Allergy Swelling     Review of Systems:  No headache, visual changes, nausea, vomiting, diarrhea, constipation, dizziness, abdominal  pain, skin rash, fevers, chills, night sweats, weight loss, swollen lymph nodes, body aches, joint swelling, chest pain, shortness of breath, mood changes. POSITIVE muscle aches  Objective  Blood pressure 112/74, pulse 72, height 5' 9 (1.753 m), weight 229 lb (103.9 kg), SpO2 100%.   General: No apparent distress alert and oriented x3 mood and affect normal, dressed appropriately.  HEENT: Pupils equal, extraocular movements intact  Respiratory: Patient's speak in full sentences and does not appear short of breath  Cardiovascular: No lower extremity edema, non tender, no erythema  Gait MSK:  Back exam shows patient does have severe tenderness over the sacroiliac joint record of the left.  Positive Faber on the right side.  Negative straight leg test noted.  Osteopathic findings  T4 extended rotated and side bent right T7 extended rotated and side bent left L2 flexed rotated and side bent right L4 flexed rotated and side bent right Sacrum right on right       Assessment and Plan:  Right lumbar radiculopathy Likely some more radicular symptoms we will continue to monitor.  Some seems to be more sacroiliac joint dysfunction as well.  Responds well though to osteopathic manipulation.  Follow-up again in 6 to 8 weeks otherwise.    Nonallopathic problems  Decision today to treat with OMT was based on Physical Exam  After verbal consent patient was treated with HVLA, ME, FPR techniques in thoracic, lumbar, and sacral  areas  Patient tolerated the procedure well with improvement in symptoms  Patient given exercises, stretches and lifestyle modifications  See medications in patient instructions if given  Patient will follow up in 4-8 weeks     The above documentation has been reviewed and is accurate and complete Sahmir Weatherbee M Rhemi Balbach, DO         Note: This dictation was prepared with Dragon dictation along with smaller phrase technology. Any transcriptional errors that result  from this process are unintentional.

## 2024-06-16 ENCOUNTER — Ambulatory Visit (INDEPENDENT_AMBULATORY_CARE_PROVIDER_SITE_OTHER): Admitting: Family Medicine

## 2024-06-16 ENCOUNTER — Other Ambulatory Visit (HOSPITAL_COMMUNITY): Payer: Self-pay

## 2024-06-16 ENCOUNTER — Encounter: Payer: Self-pay | Admitting: Family Medicine

## 2024-06-16 ENCOUNTER — Ambulatory Visit

## 2024-06-16 VITALS — BP 112/74 | HR 72 | Ht 69.0 in | Wt 229.0 lb

## 2024-06-16 DIAGNOSIS — M9903 Segmental and somatic dysfunction of lumbar region: Secondary | ICD-10-CM | POA: Diagnosis not present

## 2024-06-16 DIAGNOSIS — M5136 Other intervertebral disc degeneration, lumbar region with discogenic back pain only: Secondary | ICD-10-CM | POA: Diagnosis not present

## 2024-06-16 DIAGNOSIS — M545 Low back pain, unspecified: Secondary | ICD-10-CM

## 2024-06-16 DIAGNOSIS — M48061 Spinal stenosis, lumbar region without neurogenic claudication: Secondary | ICD-10-CM | POA: Diagnosis not present

## 2024-06-16 DIAGNOSIS — M5416 Radiculopathy, lumbar region: Secondary | ICD-10-CM | POA: Diagnosis not present

## 2024-06-16 DIAGNOSIS — M9902 Segmental and somatic dysfunction of thoracic region: Secondary | ICD-10-CM

## 2024-06-16 DIAGNOSIS — M9904 Segmental and somatic dysfunction of sacral region: Secondary | ICD-10-CM

## 2024-06-16 MED ORDER — TIZANIDINE HCL 4 MG PO TABS
4.0000 mg | ORAL_TABLET | Freq: Every day | ORAL | 3 refills | Status: AC
  Filled 2024-06-16: qty 90, 90d supply, fill #0
  Filled 2024-09-28: qty 90, 90d supply, fill #1

## 2024-06-16 MED ORDER — MELOXICAM 15 MG PO TABS
15.0000 mg | ORAL_TABLET | Freq: Every day | ORAL | 3 refills | Status: AC
  Filled 2024-06-16: qty 90, 90d supply, fill #0
  Filled 2024-09-28: qty 90, 90d supply, fill #1

## 2024-06-16 NOTE — Patient Instructions (Addendum)
 Xray today SI jt exercises Refill meloxicam  and zanaflex  See me again in 6-8 weeks

## 2024-06-16 NOTE — Assessment & Plan Note (Addendum)
 Likely some more radicular symptoms we will continue to monitor.  Chronic problem with some worsening symptoms.  Refill meloxicam  15 mg daily as well as Zanaflex  4 mg at night some seems to be more sacroiliac joint dysfunction as well.  Responds well though to osteopathic manipulation.  Follow-up again in 6 to 8 weeks otherwise.

## 2024-06-22 ENCOUNTER — Other Ambulatory Visit: Payer: Self-pay | Admitting: Primary Care

## 2024-06-22 ENCOUNTER — Other Ambulatory Visit (HOSPITAL_COMMUNITY): Payer: Self-pay

## 2024-06-22 ENCOUNTER — Other Ambulatory Visit: Payer: Self-pay

## 2024-06-22 DIAGNOSIS — R7303 Prediabetes: Secondary | ICD-10-CM

## 2024-06-22 DIAGNOSIS — I10 Essential (primary) hypertension: Secondary | ICD-10-CM

## 2024-06-22 DIAGNOSIS — E785 Hyperlipidemia, unspecified: Secondary | ICD-10-CM

## 2024-06-22 MED ORDER — ZEPBOUND 15 MG/0.5ML ~~LOC~~ SOAJ
15.0000 mg | SUBCUTANEOUS | 0 refills | Status: DC
  Filled 2024-06-22: qty 2, 28d supply, fill #0

## 2024-06-22 NOTE — Telephone Encounter (Signed)
 Patient is due for Zepbound  follow up, this will be required prior to any further refills.  Please schedule, thank you!

## 2024-06-24 NOTE — Telephone Encounter (Signed)
 Lvm sent Mychart message

## 2024-06-25 NOTE — Telephone Encounter (Signed)
 Lvm to schedule follow up requirement with medication they are taking.

## 2024-06-29 ENCOUNTER — Ambulatory Visit: Payer: Self-pay | Admitting: Family Medicine

## 2024-06-30 ENCOUNTER — Other Ambulatory Visit: Payer: Self-pay

## 2024-06-30 DIAGNOSIS — M47816 Spondylosis without myelopathy or radiculopathy, lumbar region: Secondary | ICD-10-CM

## 2024-07-30 NOTE — Discharge Instructions (Signed)

## 2024-07-31 ENCOUNTER — Ambulatory Visit
Admission: RE | Admit: 2024-07-31 | Discharge: 2024-07-31 | Disposition: A | Discharge status: Home / Self Care | Source: Ambulatory Visit | Attending: Family Medicine | Admitting: Family Medicine

## 2024-07-31 DIAGNOSIS — M47816 Spondylosis without myelopathy or radiculopathy, lumbar region: Secondary | ICD-10-CM

## 2024-07-31 DIAGNOSIS — M47817 Spondylosis without myelopathy or radiculopathy, lumbosacral region: Secondary | ICD-10-CM | POA: Diagnosis not present

## 2024-07-31 MED ORDER — IOPAMIDOL (ISOVUE-M 200) INJECTION 41%: 1.0000 mL | Freq: Once | INTRAMUSCULAR | Status: DC

## 2024-07-31 MED ORDER — METHYLPREDNISOLONE ACETATE 40 MG/ML INJ SUSP (RADIOLOG
80.0000 mg | Freq: Once | INTRAMUSCULAR | Status: AC
  Administered 2024-07-31: 80 mg via INTRA_ARTICULAR

## 2024-08-05 ENCOUNTER — Other Ambulatory Visit: Payer: Self-pay

## 2024-08-05 ENCOUNTER — Other Ambulatory Visit: Payer: Self-pay | Admitting: Urology

## 2024-08-05 DIAGNOSIS — E291 Testicular hypofunction: Secondary | ICD-10-CM

## 2024-08-11 ENCOUNTER — Other Ambulatory Visit (HOSPITAL_COMMUNITY): Payer: Self-pay

## 2024-08-14 ENCOUNTER — Other Ambulatory Visit (HOSPITAL_COMMUNITY): Payer: Self-pay

## 2024-08-31 ENCOUNTER — Encounter: Payer: Self-pay | Admitting: Radiology

## 2024-09-02 ENCOUNTER — Encounter: Payer: Self-pay | Admitting: Family Medicine

## 2024-09-02 ENCOUNTER — Other Ambulatory Visit: Payer: Self-pay | Admitting: Primary Care

## 2024-09-02 ENCOUNTER — Other Ambulatory Visit: Payer: Self-pay | Admitting: Family Medicine

## 2024-09-02 ENCOUNTER — Other Ambulatory Visit: Payer: Self-pay | Admitting: Urology

## 2024-09-02 DIAGNOSIS — E66812 Obesity, class 2: Secondary | ICD-10-CM

## 2024-09-02 DIAGNOSIS — R7303 Prediabetes: Secondary | ICD-10-CM

## 2024-09-02 DIAGNOSIS — M5416 Radiculopathy, lumbar region: Secondary | ICD-10-CM

## 2024-09-02 DIAGNOSIS — E291 Testicular hypofunction: Secondary | ICD-10-CM

## 2024-09-02 DIAGNOSIS — E785 Hyperlipidemia, unspecified: Secondary | ICD-10-CM

## 2024-09-02 DIAGNOSIS — I10 Essential (primary) hypertension: Secondary | ICD-10-CM

## 2024-09-02 NOTE — Telephone Encounter (Signed)
 Referral to [facility] sent

## 2024-09-04 ENCOUNTER — Other Ambulatory Visit: Payer: Self-pay | Admitting: Primary Care

## 2024-09-04 ENCOUNTER — Other Ambulatory Visit: Payer: Self-pay | Admitting: Urology

## 2024-09-04 ENCOUNTER — Other Ambulatory Visit (HOSPITAL_COMMUNITY): Payer: Self-pay

## 2024-09-04 DIAGNOSIS — I10 Essential (primary) hypertension: Secondary | ICD-10-CM

## 2024-09-04 DIAGNOSIS — E785 Hyperlipidemia, unspecified: Secondary | ICD-10-CM

## 2024-09-04 DIAGNOSIS — E291 Testicular hypofunction: Secondary | ICD-10-CM

## 2024-09-04 DIAGNOSIS — R7303 Prediabetes: Secondary | ICD-10-CM

## 2024-09-04 DIAGNOSIS — Z6835 Body mass index (BMI) 35.0-35.9, adult: Secondary | ICD-10-CM

## 2024-09-07 ENCOUNTER — Encounter: Payer: Self-pay | Admitting: Podiatry

## 2024-09-07 ENCOUNTER — Ambulatory Visit (INDEPENDENT_AMBULATORY_CARE_PROVIDER_SITE_OTHER)

## 2024-09-07 ENCOUNTER — Ambulatory Visit (INDEPENDENT_AMBULATORY_CARE_PROVIDER_SITE_OTHER): Admitting: Podiatry

## 2024-09-07 VITALS — Ht 69.0 in | Wt 229.0 lb

## 2024-09-07 DIAGNOSIS — M205X1 Other deformities of toe(s) (acquired), right foot: Secondary | ICD-10-CM

## 2024-09-07 NOTE — Progress Notes (Unsigned)
   Chief Complaint  Patient presents with   Post-op Problem    Pt is here due to right great toe, he states that he had surgery to the foot in 2024, since then he notice that the great toe is shorter then the rest, concern about that.    Subjective:  Patient presents today status post right great toe arthroplasty with implant.  DOS: 12/20/2022.  Patient doing well.  Pain is minimal.  He continues to compensate during ambulation but overall he states that this is more mental.  He is doing well overall  Past Medical History:  Diagnosis Date   Allergy    Depression    Gout    Hamstring strain, right, initial encounter 07/27/2021   Hemorrhoids 12/11/2007   Qualifier: Diagnosis of   By: Joshua OBIE BACKER, Sheri      IMO SNOMED Dx Update Oct 2024     History of colonic polyps 07/17/2011   Hypertension    Localized swelling of finger of left hand 07/25/2022   Sickle cell trait    Sleep apnea    wears cpap     Past Surgical History:  Procedure Laterality Date   COLONOSCOPY  2012   POLYPECTOMY     WISDOM TOOTH EXTRACTION      Allergies  Allergen Reactions   Shellfish Allergy Swelling    Objective/Physical Exam Neurovascular status intact.  Incision nicely healed.  Minimal edema noted.  No pain with range of motion.  Good range of motion of the first MTP  Radiographic Exam RT foot 12/26/2022:  Orthopedic Silastic implant with metal grommets and osteotomies sites appear to be stable with routine healing.  Assessment: 1. s/p right great toe arthroplasty with implant. DOS: 12/20/2022   Plan of Care:  1. Patient was evaluated. X-rays reviewed 2.  Continue WBAT good supportive tennis shoes and sneakers.  Slowly increase activity over the next 2-3 weeks 3.  Recommend range of motion exercises and strengthening exercises.  Heel raises 4.  Return to clinic 3 weeks to discuss return to work.  Patient planning to return to work 02/13/2023   Thresa EMERSON Sar, DPM [facility]  Dr. Thresa EMERSON Sar, DPM    [address] [place], [place] [address]                Office [phone]  Fax [phone]

## 2024-09-10 ENCOUNTER — Encounter: Payer: Self-pay | Admitting: Primary Care

## 2024-09-10 ENCOUNTER — Ambulatory Visit: Payer: Self-pay | Admitting: Primary Care

## 2024-09-10 ENCOUNTER — Ambulatory Visit: Admitting: Primary Care

## 2024-09-10 ENCOUNTER — Other Ambulatory Visit (HOSPITAL_COMMUNITY): Payer: Self-pay

## 2024-09-10 VITALS — BP 114/68 | HR 57 | Temp 97.7°F | Ht 68.5 in | Wt 237.5 lb

## 2024-09-10 DIAGNOSIS — Z0000 Encounter for general adult medical examination without abnormal findings: Secondary | ICD-10-CM | POA: Diagnosis not present

## 2024-09-10 DIAGNOSIS — Z800 Family history of malignant neoplasm of digestive organs: Secondary | ICD-10-CM

## 2024-09-10 DIAGNOSIS — F3342 Major depressive disorder, recurrent, in full remission: Secondary | ICD-10-CM

## 2024-09-10 DIAGNOSIS — Z6835 Body mass index (BMI) 35.0-35.9, adult: Secondary | ICD-10-CM

## 2024-09-10 DIAGNOSIS — E785 Hyperlipidemia, unspecified: Secondary | ICD-10-CM | POA: Diagnosis not present

## 2024-09-10 DIAGNOSIS — E66812 Obesity, class 2: Secondary | ICD-10-CM | POA: Diagnosis not present

## 2024-09-10 DIAGNOSIS — Z113 Encounter for screening for infections with a predominantly sexual mode of transmission: Secondary | ICD-10-CM

## 2024-09-10 DIAGNOSIS — R7303 Prediabetes: Secondary | ICD-10-CM

## 2024-09-10 DIAGNOSIS — R7989 Other specified abnormal findings of blood chemistry: Secondary | ICD-10-CM | POA: Diagnosis not present

## 2024-09-10 LAB — LIPID PANEL
Cholesterol: 177 mg/dL (ref 0–200)
HDL: 45.6 mg/dL (ref >39.00)
LDL Cholesterol: 117 mg/dL — ABNORMAL HIGH (ref 0–99)
NonHDL: 131.78
Total CHOL/HDL Ratio: 4
Triglycerides: 73 mg/dL (ref 0.0–149.0)
VLDL: 14.6 mg/dL (ref 0.0–40.0)

## 2024-09-10 LAB — COMPREHENSIVE METABOLIC PANEL WITH GFR
ALT: 42 U/L (ref 0–53)
AST: 24 U/L (ref 0–37)
Albumin: 4.2 g/dL (ref 3.5–5.2)
Alkaline Phosphatase: 49 U/L (ref 39–117)
BUN: 26 mg/dL — ABNORMAL HIGH (ref 6–23)
CO2: 30 meq/L (ref 19–32)
Calcium: 9.3 mg/dL (ref 8.4–10.5)
Chloride: 102 meq/L (ref 96–112)
Creatinine, Ser: 1.38 mg/dL (ref 0.40–1.50)
GFR: 60.36 mL/min (ref >60.00)
Glucose, Bld: 88 mg/dL (ref 70–99)
Potassium: 3.7 meq/L (ref 3.5–5.1)
Sodium: 140 meq/L (ref 135–145)
Total Bilirubin: 0.7 mg/dL (ref 0.2–1.2)
Total Protein: 7.2 g/dL (ref 6.0–8.3)

## 2024-09-10 LAB — PSA: PSA: 1.4 ng/mL (ref 0.10–4.00)

## 2024-09-10 LAB — HEMOGLOBIN A1C: Hgb A1c MFr Bld: 5.8 % (ref 4.6–6.5)

## 2024-09-10 MED ORDER — TIRZEPATIDE-WEIGHT MANAGEMENT 7.5 MG/0.5ML ~~LOC~~ SOAJ
7.5000 mg | SUBCUTANEOUS | 0 refills | Status: DC
  Filled 2024-09-10 – 2024-09-28 (×2): qty 2, 28d supply, fill #0

## 2024-09-10 NOTE — Assessment & Plan Note (Signed)
 Following with Urology.  Continue testosterone  0.5 ml every 7 days for which he will resume soon.

## 2024-09-10 NOTE — Assessment & Plan Note (Signed)
 Due in February 2026, he is aware.

## 2024-09-10 NOTE — Patient Instructions (Signed)
 Resume Zepbound  at 7.5 mg weekly.  Stop by the lab prior to leaving today. I will notify you of your results once received.   It was a pleasure to see you today!

## 2024-09-10 NOTE — Assessment & Plan Note (Signed)
 Stable.  Continue venlafaxine ER 37.5 mg daily.

## 2024-09-10 NOTE — Addendum Note (Signed)
 Addended by: Dez Stauffer K on: 09/10/2024 08:17 AM   Modules accepted: Orders

## 2024-09-10 NOTE — Assessment & Plan Note (Signed)
 Repeat lipid panel pending.  Work on a healthy diet and regular exercise in order for weight loss, and to reduce the risk of further co-morbidity.

## 2024-09-10 NOTE — Progress Notes (Signed)
 Subjective:    Patient ID: Joseph Ferguson, male    DOB: [birth year], 48 y.o.   MRN: [id number]  Joseph Ferguson is a very pleasant 48 y.o. male who presents today for complete physical and follow up of chronic conditions.  Immunizations: -Tetanus: Completed in 2022 -Influenza: Completed this season   Diet: Fair diet.  Exercise: Regular exercise.  Eye exam: Completes annually  Dental exam: Completes semi-annually    Colonoscopy: Completed in 2021, due 2026  Body mass index is 35.59 kg/m.    BP Readings from Last 3 Encounters:  09/10/24 114/68  07/31/24 122/88  06/16/24 112/74   His last injection of Zepbound  was in mid September 2025. He denies GI upset, diarrhea, GERD.    Review of Systems  Constitutional:  Negative for unexpected weight change.  HENT:  Negative for rhinorrhea.   Respiratory:  Negative for cough and shortness of breath.   Cardiovascular:  Negative for chest pain.  Gastrointestinal:  Negative for constipation and diarrhea.  Genitourinary:  Negative for difficulty urinating.  Musculoskeletal:  Negative for arthralgias and myalgias.  Skin:  Negative for rash.  Allergic/Immunologic: Negative for environmental allergies.  Neurological:  Negative for dizziness and headaches.  Psychiatric/Behavioral:  The patient is not nervous/anxious.          Past Medical History:  Diagnosis Date   Allergy    Depression    Gout    Hamstring strain, right, initial encounter 07/27/2021   Hemorrhoids 12/11/2007   Qualifier: Diagnosis of   By: Joseph Ferguson, Joseph Ferguson, Sheri      IMO SNOMED Dx Update Oct 2024     History of colonic polyps 07/17/2011   Hypertension    Localized swelling of finger of left hand 07/25/2022   Sickle cell trait    Sleep apnea    wears cpap     Social History   Socioeconomic History   Marital status: Single    Spouse name: Not on file   Number of children: Not on file   Years of education: Not on file   Highest education level: Not  on file  Occupational History   Not on file  Tobacco Use   Smoking status: Never   Smokeless tobacco: Never  Substance and Sexual Activity   Alcohol use: Yes    Alcohol/week: 0.0 standard drinks of alcohol    Comment: rarely   Drug use: No   Sexual activity: Yes    Birth control/protection: None  Other Topics Concern   Not on file  Social History Narrative   Divorced.   2 children.   CRNA, works for [employer].    Enjoys spending time with children, relaxing.    Social Drivers of Corporate Investment Banker Strain: Not on file  Food Insecurity: Not on file  Transportation Needs: Not on file  Physical Activity: Not on file  Stress: Not on file  Social Connections: Not on file  Intimate Partner Violence: Not on file    Past Surgical History:  Procedure Laterality Date   COLONOSCOPY  2012   POLYPECTOMY     WISDOM TOOTH EXTRACTION      Family History  Problem Relation Age of Onset   Colon cancer Maternal Grandfather 64   Hypertension Father    Colon polyps Neg Hx    Esophageal cancer Neg Hx    Rectal cancer Neg Hx    Stomach cancer Neg Hx     Allergies  Allergen Reactions   Shellfish Allergy  Swelling    Current Outpatient Medications on File Prior to Visit  Medication Sig Dispense Refill   allopurinol  (ZYLOPRIM ) 300 MG tablet Take 1 tablet (300 mg total) by mouth daily for gout prevention. 90 tablet 3   amLODipine  (NORVASC ) 10 MG tablet Take 1 tablet (10 mg total) by mouth daily for blood pressure 90 tablet 3   Azelastine  HCl 137 MCG/SPRAY SOLN Place 1-2 sprays into both nostrils 2 (two) times daily as needed. 30 mL 11   fluticasone  (FLONASE ) 50 MCG/ACT nasal spray Place 1 spray into both nostrils 2 (two) times daily or 2 sprays into each nostril once daily 16 g 11   hydrochlorothiazide  (HYDRODIURIL ) 25 MG tablet Take 1 tablet (25 mg total) by mouth daily for blood pressure. 90 tablet 3   losartan  (COZAAR ) 50 MG tablet Take 1 tablet (50 mg total) by mouth daily for  blood pressure. 90 tablet 3   meloxicam  (MOBIC ) 15 MG tablet Take 1 tablet (15 mg total) by mouth daily. 90 tablet 3   Multiple Vitamin (MULTIVITAMIN PO) Take by mouth daily. [employer] Men's multivitamin     testosterone  cypionate (DEPOTESTOSTERONE CYPIONATE) 200 MG/ML injection Inject 0.5 mLs (100 mg total) into the muscle every 7 (seven) days. 10 mL 1   tiZANidine  (ZANAFLEX ) 4 MG tablet Take 1 tablet (4 mg total) by mouth at bedtime. 90 tablet 3   venlafaxine  XR (EFFEXOR -XR) 37.5 MG 24 hr capsule Take 1 capsule (37.5 mg total) by mouth daily with breakfast for depression. 90 capsule 3   No current facility-administered medications on file prior to visit.    BP 114/68   Pulse (!) 57   Temp 97.7 F (36.5 C) (Oral)   Ht 5' 8.5 (1.74 m)   Wt 237 lb 8 oz (107.7 kg)   SpO2 99%   BMI 35.59 kg/m  Objective:   Physical Exam HENT:     Right Ear: Tympanic membrane and ear canal normal.     Left Ear: Tympanic membrane and ear canal normal.  Eyes:     Pupils: Pupils are equal, round, and reactive to light.  Cardiovascular:     Rate and Rhythm: Normal rate and regular rhythm.  Pulmonary:     Effort: Pulmonary effort is normal.     Breath sounds: Normal breath sounds.  Abdominal:     General: Bowel sounds are normal.     Palpations: Abdomen is soft.     Tenderness: There is no abdominal tenderness.  Musculoskeletal:        General: Normal range of motion.     Cervical back: Neck supple.  Skin:    General: Skin is warm and dry.  Neurological:     Mental Status: He is alert and oriented to person, place, and time.     Cranial Nerves: No cranial nerve deficit.     Deep Tendon Reflexes:     Reflex Scores:      Patellar reflexes are 2+ on the right side and 2+ on the left side. Psychiatric:        Mood and Affect: Mood normal.     Physical Exam        Assessment & Plan:  Preventative health care Assessment & Plan: Immunizations UTD. Colonoscopy UTD, due 2026, he is aware PSA  due and pending while on testesterone  Discussed the importance of a healthy diet and regular exercise in order for weight loss, and to reduce the risk of further co-morbidity.  Exam stable. Labs pending.  Follow up in 1 year for repeat physical.    Family history of colon cancer Assessment & Plan: Due in February 2026, he is aware.   Hyperlipidemia, unspecified hyperlipidemia type Assessment & Plan: Repeat lipid panel pending.  Work on a healthy diet and regular exercise in order for weight loss, and to reduce the risk of further co-morbidity.   Orders: -     Lipid panel -     Comprehensive metabolic panel with GFR  Low testosterone  in male Assessment & Plan: Following with Urology.  Continue testosterone  0.5 ml every 7 days for which he will resume soon.    Orders: -     PSA -     Testosterone  Total,Free,Bio, Males  Prediabetes Assessment & Plan: Repeat A1C pending.  Work on a healthy diet and regular exercise in order for weight loss, and to reduce the risk of further co-morbidity.   Orders: -     Hemoglobin A1c  Recurrent major depressive disorder, in full remission Assessment & Plan: Stable.  Continue venlafaxine  ER 37.5 mg daily.     Class 2 severe obesity due to excess calories with serious comorbidity and body mass index (BMI) of 35.0 to 35.9 in adult Assessment & Plan: Resume Zepbound  at 7.5 mg weekly   He will update if he develops symptoms. Will titrate upward from there.  Continue exercise.   Orders: -     Tirzepatide -Weight Management; Inject 7.5 mg into the skin once a week.  Dispense: 2 mL; Refill: 0    Assessment and Plan Assessment & Plan         Comer MARLA Gaskins, NP     History of Present Illness

## 2024-09-10 NOTE — Assessment & Plan Note (Signed)
 Repeat A1C pending.  Work on a healthy diet and regular exercise in order for weight loss, and to reduce the risk of further co-morbidity.

## 2024-09-10 NOTE — Assessment & Plan Note (Signed)
 Immunizations UTD. Colonoscopy UTD, due 2026, he is aware PSA due and pending while on testesterone  Discussed the importance of a healthy diet and regular exercise in order for weight loss, and to reduce the risk of further co-morbidity.  Exam stable. Labs pending.  Follow up in 1 year for repeat physical.

## 2024-09-10 NOTE — Assessment & Plan Note (Signed)
 Resume Zepbound  at 7.5 mg weekly   He will update if he develops symptoms. Will titrate upward from there.  Continue exercise.

## 2024-09-11 LAB — TESTOSTERONE TOTAL,FREE,BIO, MALES
Albumin: 4.2 g/dL (ref 3.6–5.1)
Sex Hormone Binding: 31 nmol/L (ref 10–50)
Testosterone, Bioavailable: 82.6 ng/dL — ABNORMAL LOW (ref 110.0–575.0)
Testosterone, Free: 42.9 pg/mL — ABNORMAL LOW (ref 46.0–224.0)
Testosterone: 314 ng/dL (ref 250–827)

## 2024-09-11 LAB — HIV ANTIBODY (ROUTINE TESTING W REFLEX)
HIV 1&2 Ab, 4th Generation: NONREACTIVE
HIV FINAL INTERPRETATION: NEGATIVE

## 2024-09-11 LAB — C. TRACHOMATIS/N. GONORRHOEAE RNA
C. trachomatis RNA, TMA: NOT DETECTED
N. gonorrhoeae RNA, TMA: NOT DETECTED

## 2024-09-11 LAB — TRICHOMONAS VAGINALIS, PROBE AMP: Trichomonas vaginalis RNA: NOT DETECTED

## 2024-09-11 LAB — HEPATITIS C ANTIBODY: Hepatitis C Ab: NONREACTIVE

## 2024-09-11 LAB — RPR: RPR Ser Ql: NONREACTIVE

## 2024-09-22 ENCOUNTER — Other Ambulatory Visit (HOSPITAL_COMMUNITY): Payer: Self-pay

## 2024-09-28 ENCOUNTER — Other Ambulatory Visit: Payer: Self-pay | Admitting: Urology

## 2024-09-28 ENCOUNTER — Other Ambulatory Visit: Payer: Self-pay | Admitting: Primary Care

## 2024-09-28 ENCOUNTER — Other Ambulatory Visit (HOSPITAL_COMMUNITY): Payer: Self-pay

## 2024-09-28 ENCOUNTER — Other Ambulatory Visit: Payer: Self-pay

## 2024-09-28 DIAGNOSIS — M1A9XX0 Chronic gout, unspecified, without tophus (tophi): Secondary | ICD-10-CM

## 2024-09-28 DIAGNOSIS — I10 Essential (primary) hypertension: Secondary | ICD-10-CM

## 2024-09-28 DIAGNOSIS — E291 Testicular hypofunction: Secondary | ICD-10-CM

## 2024-09-28 MED ORDER — AMLODIPINE BESYLATE 10 MG PO TABS
10.0000 mg | ORAL_TABLET | Freq: Every day | ORAL | 2 refills | Status: AC
  Filled 2024-09-28: qty 90, 90d supply, fill #0

## 2024-09-28 MED ORDER — ALLOPURINOL 300 MG PO TABS
300.0000 mg | ORAL_TABLET | Freq: Every day | ORAL | 2 refills | Status: AC
  Filled 2024-09-28: qty 90, 90d supply, fill #0

## 2024-09-28 MED ORDER — LOSARTAN POTASSIUM 50 MG PO TABS
50.0000 mg | ORAL_TABLET | Freq: Every day | ORAL | 2 refills | Status: AC
  Filled 2024-09-28: qty 90, 90d supply, fill #0

## 2024-09-28 MED ORDER — HYDROCHLOROTHIAZIDE 25 MG PO TABS
25.0000 mg | ORAL_TABLET | Freq: Every day | ORAL | 2 refills | Status: AC
  Filled 2024-09-28: qty 90, 90d supply, fill #0

## 2024-09-29 ENCOUNTER — Other Ambulatory Visit (HOSPITAL_COMMUNITY): Payer: Self-pay

## 2024-09-29 MED ORDER — TESTOSTERONE CYPIONATE 200 MG/ML IM SOLN
100.0000 mg | INTRAMUSCULAR | 2 refills | Status: AC
  Filled 2024-09-29: qty 4, 28d supply, fill #0
  Filled 2024-10-23 – 2024-10-27 (×3): qty 4, 28d supply, fill #1
  Filled 2024-11-23 – 2024-11-25 (×2): qty 4, 28d supply, fill #2

## 2024-10-07 ENCOUNTER — Other Ambulatory Visit (HOSPITAL_COMMUNITY): Payer: Self-pay

## 2024-10-07 MED ORDER — TIRZEPATIDE-WEIGHT MANAGEMENT 7.5 MG/0.5ML ~~LOC~~ SOAJ
7.5000 mg | SUBCUTANEOUS | 0 refills | Status: AC
  Filled 2024-10-07: qty 4, 56d supply, fill #0

## 2024-10-16 ENCOUNTER — Other Ambulatory Visit (HOSPITAL_COMMUNITY): Payer: Self-pay

## 2024-10-16 MED ORDER — ZEPBOUND 15 MG/0.5ML ~~LOC~~ SOAJ
15.0000 mg | SUBCUTANEOUS | 0 refills | Status: AC
  Filled 2024-10-16: qty 2, 28d supply, fill #0
  Filled 2024-11-23: qty 2, 28d supply, fill #1

## 2024-10-23 ENCOUNTER — Other Ambulatory Visit: Payer: Self-pay | Admitting: Primary Care

## 2024-10-23 ENCOUNTER — Other Ambulatory Visit (HOSPITAL_COMMUNITY): Payer: Self-pay

## 2024-10-23 DIAGNOSIS — F3342 Major depressive disorder, recurrent, in full remission: Secondary | ICD-10-CM

## 2024-10-23 MED ORDER — VENLAFAXINE HCL ER 37.5 MG PO CP24
37.5000 mg | ORAL_CAPSULE | Freq: Every day | ORAL | 2 refills | Status: AC
  Filled 2024-10-23: qty 90, 90d supply, fill #0

## 2024-10-26 ENCOUNTER — Other Ambulatory Visit (HOSPITAL_COMMUNITY): Payer: Self-pay

## 2024-10-27 ENCOUNTER — Other Ambulatory Visit (HOSPITAL_COMMUNITY): Payer: Self-pay

## 2024-11-24 ENCOUNTER — Other Ambulatory Visit (HOSPITAL_COMMUNITY): Payer: Self-pay

## 2024-11-24 ENCOUNTER — Telehealth (HOSPITAL_COMMUNITY): Payer: Self-pay

## 2024-11-24 ENCOUNTER — Other Ambulatory Visit: Payer: Self-pay

## 2024-11-24 NOTE — Telephone Encounter (Signed)
 Pharmacy Patient Advocate Encounter   Received notification from Pt Calls Messages that prior authorization for Testosterone  Cypionate 200MG /ML intramuscular solution  is required/requested.   Insurance verification completed.   The patient is insured through [facility].   Per test claim: PA required; PA submitted to above mentioned insurance via Latent Key/confirmation #/EOC [id number] Status is pending

## 2024-11-24 NOTE — Telephone Encounter (Signed)
 PA request has been Received. New Encounter has been or will be created for follow up. For additional info see Pharmacy Prior Auth telephone encounter from 11/24/24.

## 2024-11-25 ENCOUNTER — Other Ambulatory Visit: Payer: Self-pay

## 2024-11-25 ENCOUNTER — Other Ambulatory Visit (HOSPITAL_COMMUNITY): Payer: Self-pay

## 2024-11-25 NOTE — Telephone Encounter (Signed)
 Pharmacy Patient Advocate Encounter  Received notification from [facility] that Prior Authorization for Testosterone  Cypionate 200MG /ML intramuscular solution  has been APPROVED from 11/25/24 to 11/24/25. Ran test claim, Copay is $37.64. This test claim was processed through [facility]- copay amounts may vary at other pharmacies due to pharmacy/plan contracts, or as the patient moves through the different stages of their insurance plan.   PA #/Case ID/Reference #: [id number]

## 2025-01-26 ENCOUNTER — Other Ambulatory Visit

## 2025-01-29 ENCOUNTER — Ambulatory Visit: Admitting: Urology
